# Patient Record
Sex: Male | Born: 1935 | Race: Black or African American | Hispanic: No | Smoking: Former smoker
Health system: Southern US, Community
[De-identification: ages and names within clinical notes are randomized; demographics above are authoritative.]

## PROBLEM LIST (undated history)

## (undated) DIAGNOSIS — F0280 Dementia in other diseases classified elsewhere without behavioral disturbance: Secondary | ICD-10-CM

## (undated) DIAGNOSIS — G309 Alzheimer's disease, unspecified: Secondary | ICD-10-CM

## (undated) DIAGNOSIS — K7460 Unspecified cirrhosis of liver: Secondary | ICD-10-CM

## (undated) DIAGNOSIS — K769 Liver disease, unspecified: Secondary | ICD-10-CM

## (undated) DIAGNOSIS — H00019 Hordeolum externum unspecified eye, unspecified eyelid: Secondary | ICD-10-CM

## (undated) DIAGNOSIS — F0390 Unspecified dementia without behavioral disturbance: Secondary | ICD-10-CM

## (undated) DIAGNOSIS — K7290 Hepatic failure, unspecified without coma: Secondary | ICD-10-CM

---

## 2013-11-14 ENCOUNTER — Emergency Department (HOSPITAL_COMMUNITY)
Admission: EM | Admit: 2013-11-14 | Discharge: 2013-11-14 | Disposition: A | Discharge status: Home / Self Care | Payer: Medicare Other | Attending: Emergency Medicine | Admitting: Emergency Medicine

## 2013-11-14 ENCOUNTER — Emergency Department (HOSPITAL_COMMUNITY): Payer: Medicare Other

## 2013-11-14 DIAGNOSIS — Y9389 Activity, other specified: Secondary | ICD-10-CM | POA: Diagnosis not present

## 2013-11-14 DIAGNOSIS — IMO0002 Reserved for concepts with insufficient information to code with codable children: Secondary | ICD-10-CM

## 2013-11-14 DIAGNOSIS — Z8719 Personal history of other diseases of the digestive system: Secondary | ICD-10-CM | POA: Diagnosis not present

## 2013-11-14 DIAGNOSIS — W1809XA Striking against other object with subsequent fall, initial encounter: Secondary | ICD-10-CM | POA: Diagnosis not present

## 2013-11-14 DIAGNOSIS — Z8669 Personal history of other diseases of the nervous system and sense organs: Secondary | ICD-10-CM | POA: Diagnosis not present

## 2013-11-14 DIAGNOSIS — Y929 Unspecified place or not applicable: Secondary | ICD-10-CM | POA: Diagnosis not present

## 2013-11-14 DIAGNOSIS — S0990XA Unspecified injury of head, initial encounter: Secondary | ICD-10-CM | POA: Diagnosis present

## 2013-11-14 DIAGNOSIS — Z792 Long term (current) use of antibiotics: Secondary | ICD-10-CM | POA: Diagnosis not present

## 2013-11-14 DIAGNOSIS — Z7982 Long term (current) use of aspirin: Secondary | ICD-10-CM | POA: Diagnosis not present

## 2013-11-14 DIAGNOSIS — D649 Anemia, unspecified: Secondary | ICD-10-CM

## 2013-11-14 DIAGNOSIS — G309 Alzheimer's disease, unspecified: Secondary | ICD-10-CM | POA: Insufficient documentation

## 2013-11-14 DIAGNOSIS — S1093XA Contusion of unspecified part of neck, initial encounter: Principal | ICD-10-CM

## 2013-11-14 DIAGNOSIS — S0003XA Contusion of scalp, initial encounter: Secondary | ICD-10-CM | POA: Insufficient documentation

## 2013-11-14 DIAGNOSIS — Z79899 Other long term (current) drug therapy: Secondary | ICD-10-CM | POA: Insufficient documentation

## 2013-11-14 DIAGNOSIS — F0280 Dementia in other diseases classified elsewhere without behavioral disturbance: Secondary | ICD-10-CM | POA: Diagnosis not present

## 2013-11-14 DIAGNOSIS — S0083XA Contusion of other part of head, initial encounter: Principal | ICD-10-CM | POA: Insufficient documentation

## 2013-11-14 HISTORY — DX: Dementia in other diseases classified elsewhere without behavioral disturbance: F02.80

## 2013-11-14 HISTORY — DX: Liver disease, unspecified: K76.9

## 2013-11-14 HISTORY — DX: Alzheimer's disease, unspecified: G30.9

## 2013-11-14 HISTORY — DX: Unspecified dementia without behavioral disturbance: F03.90

## 2013-11-14 HISTORY — DX: Hepatic failure, unspecified without coma: K72.90

## 2013-11-14 HISTORY — DX: Hordeolum externum unspecified eye, unspecified eyelid: H00.019

## 2013-11-14 LAB — BASIC METABOLIC PANEL
BUN: 18 mg/dL (ref 6–23)
CALCIUM: 9.3 mg/dL (ref 8.4–10.5)
CHLORIDE: 107 meq/L (ref 96–112)
CO2: 25 meq/L (ref 19–32)
Creatinine, Ser: 1.06 mg/dL (ref 0.50–1.35)
GFR calc Af Amer: 74 mL/min — ABNORMAL LOW (ref >90)
GFR calc non Af Amer: 64 mL/min — ABNORMAL LOW (ref >90)
Glucose, Bld: 97 mg/dL (ref 70–99)
Potassium: 4.1 mEq/L (ref 3.7–5.3)
SODIUM: 142 meq/L (ref 137–147)

## 2013-11-14 LAB — CBC WITH DIFFERENTIAL/PLATELET
BASOS ABS: 0 10*3/uL (ref 0.0–0.1)
Basophils Relative: 0 % (ref 0–1)
Eosinophils Absolute: 0 10*3/uL (ref 0.0–0.7)
Eosinophils Relative: 1 % (ref 0–5)
HCT: 27.7 % — ABNORMAL LOW (ref 39.0–52.0)
Hemoglobin: 9.1 g/dL — ABNORMAL LOW (ref 13.0–17.0)
LYMPHS PCT: 22 % (ref 12–46)
Lymphs Abs: 0.7 10*3/uL (ref 0.7–4.0)
MCH: 31.2 pg (ref 26.0–34.0)
MCHC: 32.9 g/dL (ref 30.0–36.0)
MCV: 94.9 fL (ref 78.0–100.0)
Monocytes Absolute: 0.2 10*3/uL (ref 0.1–1.0)
Monocytes Relative: 8 % (ref 3–12)
NEUTROS ABS: 2.2 10*3/uL (ref 1.7–7.7)
Neutrophils Relative %: 70 % (ref 43–77)
PLATELETS: 99 10*3/uL — AB (ref 150–400)
RBC: 2.92 MIL/uL — ABNORMAL LOW (ref 4.22–5.81)
RDW: 14.1 % (ref 11.5–15.5)
WBC: 3.1 10*3/uL — AB (ref 4.0–10.5)

## 2013-11-14 LAB — OCCULT BLOOD, POC DEVICE: Fecal Occult Bld: NEGATIVE

## 2013-11-14 LAB — URINALYSIS, ROUTINE W REFLEX MICROSCOPIC
Bilirubin Urine: NEGATIVE
GLUCOSE, UA: NEGATIVE mg/dL
Ketones, ur: NEGATIVE mg/dL
LEUKOCYTES UA: NEGATIVE
Nitrite: NEGATIVE
PH: 6.5 (ref 5.0–8.0)
PROTEIN: NEGATIVE mg/dL
Specific Gravity, Urine: 1.017 (ref 1.005–1.030)
Urobilinogen, UA: 1 mg/dL (ref 0.0–1.0)

## 2013-11-14 LAB — URINE MICROSCOPIC-ADD ON

## 2013-11-14 MED ORDER — SODIUM CHLORIDE 0.9 % IV SOLN
INTRAVENOUS | Status: DC
  Administered 2013-11-14: 09:00:00 via INTRAVENOUS

## 2013-11-14 NOTE — ED Notes (Signed)
Pt placed on bedpan with one staff assist to have BM. Pericare done, diaper applied, and pt repositioned in bed.

## 2013-11-14 NOTE — ED Provider Notes (Signed)
CSN: [id number]     Arrival date & time 11/14/13  0719 History   First MD Initiated Contact with Patient 11/14/13 0732     Chief Complaint  Patient presents with  . Fall    HPI The patient presents to emergency room for evaluation of a head injury. The patient himself does not recall what happened. He is not sure why he is here in the emergency room. The patient lives in a senior living facility. According to EMS reports, the patient was sitting in a chair when the chair slipped out from underneath him.  Patient struck the back of his head on the chair. Patient does complain of a mild headache.  He denies any other complaints. He denies chest pain or shortness of breath. He denies abdominal pain. He denies nausea vomiting diarrhea or fever. He denies any numbness or weakness. Past Medical History  Diagnosis Date  . Dementia   . Alzheimer disease   . Hepatic encephalopathy   . Liver disease   . Stye    History reviewed. No pertinent past surgical history.*No family history on file. History  Substance Use Topics  . Smoking status: Unknown If Ever Smoked  . Smokeless tobacco: Not on file  . Alcohol Use: No    Review of Systems  All other systems reviewed and are negative.      Allergies  Review of patient's allergies indicates no known allergies.  Home Medications   Current Outpatient Rx  Name  Route  Sig  Dispense  Refill  . aspirin 81 MG tablet   Oral   Take 81 mg by mouth daily.         . bicalutamide (CASODEX) 50 MG tablet   Oral   Take 50 mg by mouth daily.         Marland Kitchen erythromycin ophthalmic ointment      Apply 1/4 inch ribbon in the left eye four times per day         . haloperidol (HALDOL) 1 MG tablet   Oral   Take 1 mg by mouth 2 (two) times daily.         Marland Kitchen lactulose (CHRONULAC) 10 GM/15ML solution   Oral   Take 30 g by mouth 2 (two) times daily as needed for mild constipation.         . Melatonin 1 MG TABS   Oral   Take 1 tablet by mouth at  bedtime as needed (sleep).         . Multiple Vitamin (THERA/BETA-CAROTENE PO)   Oral   Take 1 tablet by mouth daily.         Marland Kitchen omeprazole (PRILOSEC) 20 MG capsule   Oral   Take 20 mg by mouth daily.          BP 149/65  Pulse 69  Temp(Src) 97.8 F (36.6 C) (Oral)  Resp 20  SpO2 100% Physical Exam  Nursing note and vitals reviewed. Constitutional: He appears well-developed and well-nourished. No distress.  HENT:  Head: Normocephalic.  Right Ear: External ear normal.  Left Ear: External ear normal.  Hematoma of the left posterior occipital region, no step-off appreciated  Eyes: Conjunctivae are normal. Right eye exhibits no discharge. Left eye exhibits no discharge. No scleral icterus.  Neck: Neck supple. No tracheal deviation present.  Cardiovascular: Normal rate, regular rhythm and intact distal pulses.   Pulmonary/Chest: Effort normal and breath sounds normal. No stridor. No respiratory distress. He has no wheezes. He has  no rales.  Abdominal: Soft. Bowel sounds are normal. He exhibits no distension. There is no tenderness. There is no rebound and no guarding.  Musculoskeletal: He exhibits no edema and no tenderness.       Cervical back: Normal.       Thoracic back: Normal.       Lumbar back: Normal.  No pain with range of motion of all 4 extremities  Neurological: He is alert. He has normal strength. No cranial nerve deficit (no facial droop, extraocular movements intact, no slurred speech) or sensory deficit. He exhibits normal muscle tone. He displays no seizure activity. Coordination normal.  Skin: Skin is warm and dry. No rash noted. He is not diaphoretic.  Psychiatric: He has a normal mood and affect.    ED Course  Procedures (including critical care time) Labs Review Labs Reviewed  CBC WITH DIFFERENTIAL - Abnormal; Notable for the following:    WBC 3.1 (*)    RBC 2.92 (*)    Hemoglobin 9.1 (*)    HCT 27.7 (*)    Platelets 99 (*)    All other components  within normal limits  BASIC METABOLIC PANEL - Abnormal; Notable for the following:    GFR calc non Af Amer 64 (*)    GFR calc Af Amer 74 (*)    All other components within normal limits  URINALYSIS, ROUTINE W REFLEX MICROSCOPIC - Abnormal; Notable for the following:    APPearance CLOUDY (*)    Hgb urine dipstick MODERATE (*)    All other components within normal limits  URINE MICROSCOPIC-ADD ON  OCCULT BLOOD, POC DEVICE   Imaging Review Ct Head Wo Contrast  11/14/2013   CLINICAL DATA:  Pain post trauma  EXAM: CT HEAD WITHOUT CONTRAST  TECHNIQUE: Contiguous axial images were obtained from the base of the skull through the vertex without intravenous contrast.  COMPARISON:  September 07, 2010  FINDINGS: There is moderate diffuse atrophy, stable. There is no mass, hemorrhage, extra-axial fluid collection, or midline shift. There is small vessel disease throughout the centra semiovale bilaterally. There is also small vessel disease in both internal and external capsules bilaterally. 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 There is no acute appearing infarct.  Bony calvarium appears intact. The mastoid air cells are clear. There is a small left parietal scalp hematoma.  IMPRESSION: Atrophy with supratentorial small vessel disease. 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 There is no intracranial mass, hemorrhage, or acute appearing infarct. There is a small left parietal scalp hematoma.   Electronically Signed   By: Lowella Grip M.D.   On: 11/14/2013 09:00   Ct Cervical Spine Wo Contrast  11/14/2013   CLINICAL DATA:  Fall.  Neck pain  EXAM: CT CERVICAL SPINE WITHOUT CONTRAST  TECHNIQUE: Multidetector CT imaging of the cervical spine was performed without intravenous contrast. Multiplanar CT image reconstructions were also generated.  COMPARISON:  09/07/10  FINDINGS: Straightening of normal cervical lordosis is identified. The vertebral body heights are well preserved.  There is no evidence for cervical spine fracture. Multi level disc space narrowing and ventral endplate spurring is identified compatible with degenerative disc disease. Schmorl's node is identified at the superior endplate of T1. Vascular calcifications are noted involving the carotid arteries.  IMPRESSION: 1. No acute findings. 2. Cervical spondylosis noted.   Electronically Signed   By: Kerby Moors M.D.   On: 11/14/2013 09:13    EKG Interpretation    Date/Time:  Tuesday November 14 2013 07:55:29 EST Ventricular Rate:  71 PR Interval:  221 QRS Duration: 82 QT Interval:  415 QTC Calculation: 451 R Axis:   65 Text Interpretation:  Sinus rhythm Prolonged PR interval PR interval prolonged since last tracing Confirmed by Sufian Ravi  MD-J, Isabel Freese ([id number]) on 11/14/2013 7:59:16 AM           0900  CT head completed.  Pt complained of neck pain with rad tech.  Will add on c spine. MDM   Final diagnoses:  Cephalohematoma  Anemia    No sign of serious injury associated with his fall.  Pt at baseline uses a wheelchair and does not walk per report from facility.  Pt is mildly confused but does have history of dementia.  Pt is aware that he does have anemia.  No recent labs to compare but no evidence of active bleeding.  He does not appear to symptomatic.  Stable to follow up with PCP     Kathalene Frames, MD 11/14/13 1005

## 2013-11-14 NOTE — ED Notes (Signed)
Unsuccessful IV attempt by this RN. Alvy Beal verbalizes will attempt IV insertion with [place].

## 2013-11-14 NOTE — ED Notes (Signed)
Pt arrived via EMS from [facility] s/p fall. Pt attempting to sit in chair, chair slide out from underneath patient causing him to strike head on chair. Pt denies loc or any complaints. Pt alert mae randomly.

## 2013-11-14 NOTE — ED Notes (Signed)
[facility] called for transport to [facility].  °

## 2013-11-14 NOTE — ED Notes (Signed)
Bed: YK99 Expected date: 11/14/13 Expected time: 6:47 AM Means of arrival: Ambulance Comments: Fall/head injury

## 2013-11-14 NOTE — Discharge Instructions (Signed)
Anemia, Nonspecific Anemia is a condition in which the concentration of red blood cells or hemoglobin in the blood is below normal. Hemoglobin is a substance in red blood cells that carries oxygen to the tissues of the body. Anemia results in not enough oxygen reaching these tissues.  CAUSES  Common causes of anemia include:   Excessive bleeding. Bleeding may be internal or external. This includes excessive bleeding from periods (in women) or from the intestine.   Poor nutrition.   Chronic kidney, thyroid, and liver disease.  Bone marrow disorders that decrease red blood cell production.  Cancer and treatments for cancer.  HIV, AIDS, and their treatments.  Spleen problems that increase red blood cell destruction.  Blood disorders.  Excess destruction of red blood cells due to infection, medicines, and autoimmune disorders. SIGNS AND SYMPTOMS   Minor weakness.   Dizziness.   Headache.  Palpitations.   Shortness of breath, especially with exercise.   Paleness.  Cold sensitivity.  Indigestion.  Nausea.  Difficulty sleeping.  Difficulty concentrating. Symptoms may occur suddenly or they may develop slowly.  DIAGNOSIS  Additional blood tests are often needed. These help your health care provider determine the best treatment. Your health care provider will check your stool for blood and look for other causes of blood loss.  TREATMENT  Treatment varies depending on the cause of the anemia. Treatment can include:   Supplements of iron, vitamin F81, or folic acid.   Hormone medicines.   A blood transfusion. This may be needed if blood loss is severe.   Hospitalization. This may be needed if there is significant continual blood loss.   Dietary changes.  Spleen removal. HOME CARE INSTRUCTIONS Keep all follow-up appointments. It often takes many weeks to correct anemia, and having your health care provider check on your condition and your response to  treatment is very important. SEEK IMMEDIATE MEDICAL CARE IF:   You develop extreme weakness, shortness of breath, or chest pain.   You become dizzy or have trouble concentrating.  You develop heavy vaginal bleeding.   You develop a rash.   You have bloody or black, tarry stools.   You faint.   You vomit up blood.   You vomit repeatedly.   You have abdominal pain.  You have a fever or persistent symptoms for more than 2 3 days.   You have a fever and your symptoms suddenly get worse.   You are dehydrated.  MAKE SURE YOU:  Understand these instructions.  Will watch your condition.  Will get help right away if you are not doing well or get worse. Document Released: 10/22/2004 Document Revised: 05/17/2013 Document Reviewed: 03/10/2013 [facility] Patient Information 2014 [place].  Contusion A contusion is a deep bruise. Contusions are the result of an injury that caused bleeding under the skin. The contusion may turn blue, purple, or yellow. Minor injuries will give you a painless contusion, but more severe contusions may stay painful and swollen for a few weeks.  CAUSES  A contusion is usually caused by a blow, trauma, or direct force to an area of the body. SYMPTOMS   Swelling and redness of the injured area.  Bruising of the injured area.  Tenderness and soreness of the injured area.  Pain. DIAGNOSIS  The diagnosis can be made by taking a history and physical exam. An X-ray, CT scan, or MRI may be needed to determine if there were any associated injuries, such as fractures. TREATMENT  Specific treatment will depend  on what area of the body was injured. In general, the best treatment for a contusion is resting, icing, elevating, and applying cold compresses to the injured area. Over-the-counter medicines may also be recommended for pain control. Ask your caregiver what the best treatment is for your contusion. HOME CARE INSTRUCTIONS   Put ice on the  injured area.  Put ice in a plastic bag.  Place a towel between your skin and the bag.  Leave the ice on for 15-20 minutes, 03-04 times a day.  Only take over-the-counter or prescription medicines for pain, discomfort, or fever as directed by your caregiver. Your caregiver may recommend avoiding anti-inflammatory medicines (aspirin, ibuprofen, and naproxen) for 48 hours because these medicines may increase bruising.  Rest the injured area.  If possible, elevate the injured area to reduce swelling. SEEK IMMEDIATE MEDICAL CARE IF:   You have increased bruising or swelling.  You have pain that is getting worse.  Your swelling or pain is not relieved with medicines. MAKE SURE YOU:   Understand these instructions.  Will watch your condition.  Will get help right away if you are not doing well or get worse. Document Released: 06/24/2005 Document Revised: 12/07/2011 Document Reviewed: 07/20/2011 [facility] Patient Information 2014 [place], [place].

## 2014-05-31 ENCOUNTER — Encounter (HOSPITAL_COMMUNITY): Payer: Self-pay | Admitting: Emergency Medicine

## 2014-05-31 ENCOUNTER — Emergency Department (HOSPITAL_COMMUNITY)
Admission: EM | Admit: 2014-05-31 | Discharge: 2014-06-01 | Disposition: A | Discharge status: Home / Self Care | Payer: Medicare Other | Attending: Emergency Medicine | Admitting: Emergency Medicine

## 2014-05-31 DIAGNOSIS — Z79899 Other long term (current) drug therapy: Secondary | ICD-10-CM | POA: Diagnosis not present

## 2014-05-31 DIAGNOSIS — G309 Alzheimer's disease, unspecified: Secondary | ICD-10-CM | POA: Diagnosis not present

## 2014-05-31 DIAGNOSIS — F0280 Dementia in other diseases classified elsewhere without behavioral disturbance: Secondary | ICD-10-CM | POA: Insufficient documentation

## 2014-05-31 DIAGNOSIS — F4329 Adjustment disorder with other symptoms: Secondary | ICD-10-CM

## 2014-05-31 DIAGNOSIS — Z7982 Long term (current) use of aspirin: Secondary | ICD-10-CM | POA: Diagnosis not present

## 2014-05-31 DIAGNOSIS — D649 Anemia, unspecified: Secondary | ICD-10-CM | POA: Diagnosis not present

## 2014-05-31 DIAGNOSIS — Z8719 Personal history of other diseases of the digestive system: Secondary | ICD-10-CM | POA: Insufficient documentation

## 2014-05-31 DIAGNOSIS — F0390 Unspecified dementia without behavioral disturbance: Secondary | ICD-10-CM | POA: Insufficient documentation

## 2014-05-31 DIAGNOSIS — F4324 Adjustment disorder with disturbance of conduct: Secondary | ICD-10-CM | POA: Insufficient documentation

## 2014-05-31 HISTORY — DX: Unspecified cirrhosis of liver: K74.60

## 2014-05-31 HISTORY — DX: Dementia in other diseases classified elsewhere, unspecified severity, without behavioral disturbance, psychotic disturbance, mood disturbance, and anxiety: F02.80

## 2014-05-31 HISTORY — DX: Alzheimer's disease, unspecified: G30.9

## 2014-05-31 LAB — I-STAT CHEM 8, ED
BUN: 25 mg/dL — AB (ref 6–23)
CALCIUM ION: 1.25 mmol/L (ref 1.13–1.30)
CHLORIDE: 114 meq/L — AB (ref 96–112)
CREATININE: 1.3 mg/dL (ref 0.50–1.35)
Glucose, Bld: 111 mg/dL — ABNORMAL HIGH (ref 70–99)
HCT: 28 % — ABNORMAL LOW (ref 39.0–52.0)
Hemoglobin: 9.5 g/dL — ABNORMAL LOW (ref 13.0–17.0)
Potassium: 3.9 mEq/L (ref 3.7–5.3)
Sodium: 148 mEq/L — ABNORMAL HIGH (ref 137–147)
TCO2: 23 mmol/L (ref 0–100)

## 2014-05-31 NOTE — Discharge Instructions (Signed)
If you were given medicines take as directed.  If you are on coumadin or contraceptives realize their levels and effectiveness is altered by many different medicines.  If you have any reaction (rash, tongues swelling, other) to the medicines stop taking and see a physician.   Please follow up as directed and return to the ER or see a physician for new or worsening symptoms.  Thank you. Filed Vitals:   05/31/14 2128 05/31/14 2212 05/31/14 2300 05/31/14 2344  BP: 138/49 134/53 148/60 144/56  Pulse: 80 78 78 74  Temp: 98.8 F (37.1 C)     TempSrc: Oral     Resp: 18 14  20   SpO2: 99% 100% 100% 100%
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
## 2014-11-07 ENCOUNTER — Emergency Department (HOSPITAL_COMMUNITY): Payer: Medicare Other

## 2014-11-07 ENCOUNTER — Emergency Department (HOSPITAL_COMMUNITY)
Admission: EM | Admit: 2014-11-07 | Discharge: 2014-11-07 | Disposition: A | Discharge status: Home / Self Care | Payer: Medicare Other | Attending: Emergency Medicine | Admitting: Emergency Medicine

## 2014-11-07 ENCOUNTER — Encounter (HOSPITAL_COMMUNITY): Payer: Self-pay

## 2014-11-07 DIAGNOSIS — Y998 Other external cause status: Secondary | ICD-10-CM | POA: Diagnosis not present

## 2014-11-07 DIAGNOSIS — Z792 Long term (current) use of antibiotics: Secondary | ICD-10-CM | POA: Diagnosis not present

## 2014-11-07 DIAGNOSIS — Z8719 Personal history of other diseases of the digestive system: Secondary | ICD-10-CM | POA: Insufficient documentation

## 2014-11-07 DIAGNOSIS — F0280 Dementia in other diseases classified elsewhere without behavioral disturbance: Secondary | ICD-10-CM | POA: Insufficient documentation

## 2014-11-07 DIAGNOSIS — Y9389 Activity, other specified: Secondary | ICD-10-CM | POA: Diagnosis not present

## 2014-11-07 DIAGNOSIS — S3992XA Unspecified injury of lower back, initial encounter: Secondary | ICD-10-CM | POA: Insufficient documentation

## 2014-11-07 DIAGNOSIS — Z79899 Other long term (current) drug therapy: Secondary | ICD-10-CM | POA: Insufficient documentation

## 2014-11-07 DIAGNOSIS — W19XXXA Unspecified fall, initial encounter: Secondary | ICD-10-CM

## 2014-11-07 DIAGNOSIS — Z7982 Long term (current) use of aspirin: Secondary | ICD-10-CM | POA: Insufficient documentation

## 2014-11-07 DIAGNOSIS — G309 Alzheimer's disease, unspecified: Secondary | ICD-10-CM | POA: Diagnosis not present

## 2014-11-07 DIAGNOSIS — W1839XA Other fall on same level, initial encounter: Secondary | ICD-10-CM | POA: Insufficient documentation

## 2014-11-07 DIAGNOSIS — Y9289 Other specified places as the place of occurrence of the external cause: Secondary | ICD-10-CM | POA: Insufficient documentation

## 2014-11-07 MED ORDER — ACETAMINOPHEN 325 MG PO TABS
650.0000 mg | ORAL_TABLET | Freq: Once | ORAL | Status: AC
  Administered 2014-11-07: 650 mg via ORAL
  Filled 2014-11-07: qty 2

## 2014-11-07 NOTE — ED Provider Notes (Signed)
CSN: [id number]     Arrival date & time 11/07/14  0810 History   First MD Initiated Contact with Patient 11/07/14 [phone]     Chief Complaint  Patient presents with  . Fall  . Back Pain      HPI  She presents for evaluation after a fall at a skilled nursing facilities morning. Found sitting on his buttocks in the hallway. States he hit his head but was not noted to have any visible trauma or laceration. At his baseline per evaluation at [facility] per staff. No recent illness. Denies any areas of pain other than the headache and low back pain. No cough sputum or difficult breathing. No recent fevers chills per his chart accompanying him.  Past Medical History  Diagnosis Date  . Alzheimer's dementia   . Liver cirrhosis   . Alzheimer's dementia    No past surgical history on file. No family history on file. History  Substance Use Topics  . Smoking status: Not on file  . Smokeless tobacco: Not on file  . Alcohol Use: Not on file    Review of Systems  Unable to perform ROS: Dementia      Allergies  Review of patient's allergies indicates no known allergies.  Home Medications   Prior to Admission medications   Medication Sig Start Date End Date Taking? Authorizing Provider  acetaminophen (TYLENOL) 500 MG tablet Take 500 mg by mouth every 4 (four) hours as needed for moderate pain or fever.   Yes Historical Provider, MD  alum & mag hydroxide-simeth (MAALOX/MYLANTA) 200-200-20 MG/5ML suspension Take 30 mLs by mouth every 6 (six) hours as needed for indigestion or heartburn.   Yes Historical Provider, MD  aspirin 81 MG tablet Take 81 mg by mouth daily.   Yes Historical Provider, MD  bicalutamide (CASODEX) 50 MG tablet Take 50 mg by mouth daily.   Yes Historical Provider, MD  Cholecalciferol (VITAMIN D-3) 5000 UNITS TABS Take 1 capsule by mouth daily.   Yes Historical Provider, MD  furosemide (LASIX) 20 MG tablet Take 20 mg by mouth daily.   Yes Historical Provider, MD   guaiFENesin (ROBITUSSIN) 100 MG/5ML liquid Take 200 mg by mouth every 6 (six) hours as needed for cough.   Yes Historical Provider, MD  haloperidol (HALDOL) 1 MG tablet Take 1 mg by mouth 2 (two) times daily.   Yes Historical Provider, MD  lactulose (CHRONULAC) 10 GM/15ML solution Take 30 g by mouth 2 (two) times daily.    Yes Historical Provider, MD  latanoprost (XALATAN) 0.005 % ophthalmic solution Place 1 drop into both eyes at bedtime.   Yes Historical Provider, MD  loperamide (IMODIUM) 2 MG capsule Take 2 mg by mouth daily as needed for diarrhea or loose stools.   Yes Historical Provider, MD  magnesium hydroxide (MILK OF MAGNESIA) 400 MG/5ML suspension Take 30 mLs by mouth daily as needed for mild constipation.   Yes Historical Provider, MD  Melatonin 1 MG TABS Take 1 tablet by mouth at bedtime.   Yes Historical Provider, MD  Multiple Vitamin (THERA/BETA-CAROTENE) TABS Take 1 tablet by mouth daily.   Yes Historical Provider, MD  neomycin-bacitracin-polymyxin (NEOSPORIN) [phone] ointment Apply 1 application topically daily as needed (for skin tears or abrasions).   Yes Historical Provider, MD  omeprazole (PRILOSEC) 20 MG capsule Take 20 mg by mouth daily.   Yes Historical Provider, MD  rivastigmine (EXELON) 9.5 mg/24hr Place 9.5 mg onto the skin daily.   Yes Historical Provider, MD  sucralfate (CARAFATE) 1 G tablet Take 1 g by mouth 2 (two) times daily.   Yes Historical Provider, MD  traMADol (ULTRAM) 50 MG tablet Take 50 mg by mouth as directed. Take 50mg  Daily; also may take 1 tablet every 6 hours as needed for pain. ( do not take more than 200mg /24 hours)   Yes Historical Provider, MD  amoxicillin-clavulanate (AUGMENTIN) 875-125 MG per tablet Take 1 tablet by mouth 2 (two) times daily.    Historical Provider, MD   BP 151/55 mmHg  Pulse 85  Temp(Src) 97.8 F (36.6 C) (Oral)  Resp 18  SpO2 98% Physical Exam  Constitutional: He appears well-developed and well-nourished. No distress.   HENT:  Head: Normocephalic.  Eyes: Conjunctivae are normal. Pupils are equal, round, and reactive to light. No scleral icterus.  Neck: Normal range of motion. Neck supple. No thyromegaly present.  Cardiovascular: Normal rate and regular rhythm.  Exam reveals no gallop and no friction rub.   No murmur heard. Pulmonary/Chest: Effort normal and breath sounds normal. No respiratory distress. He has no wheezes. He has no rales.  Abdominal: Soft. Bowel sounds are normal. He exhibits no distension. There is no tenderness. There is no rebound.  Musculoskeletal: Normal range of motion.  Neurological: He is alert.  Skin: Skin is warm and dry. No rash noted.  Psychiatric: He has a normal mood and affect. His behavior is normal.    ED Course  Procedures (including critical care time) Labs Review Labs Reviewed - No data to display  Imaging Review Dg Lumbar Spine Complete  11/07/2014   CLINICAL DATA:  Recent fall with low back pain radiating into the hips bilaterally  EXAM: LUMBAR SPINE - COMPLETE 4+ VIEW  COMPARISON:  None.  FINDINGS: Five lumbar type vertebral bodies are well visualized. Vertebral body height is well maintained. Osteophytic changes are noted at all levels. No significant disc space narrowing is seen. No pars defects are noted. Diffuse aortoiliac calcifications are seen.  IMPRESSION: Multilevel degenerative change without acute abnormality.   Electronically Signed   By: Inez Catalina M.D.   On: 11/07/2014 09:56   Ct Head Wo Contrast  11/07/2014   CLINICAL DATA:  Fall with head injury.  Initial encounter.  EXAM: CT HEAD WITHOUT CONTRAST  CT CERVICAL SPINE WITHOUT CONTRAST  TECHNIQUE: Multidetector CT imaging of the head and cervical spine was performed following the standard protocol without intravenous contrast. Multiplanar CT image reconstructions of the cervical spine were also generated.  COMPARISON:  None.  FINDINGS: CT HEAD FINDINGS  Skull and Sinuses:There is opacification the  right sphenoid sinus, with large lateral recess. No mucocele as the anterior sphenoid sinus is aerated and the sphenoid sinus ostium is patent. Symmetric appearance of the carotid canals.  Orbits: Bilateral cataract resection.  No traumatic findings  Brain: No evidence of acute infarction, hemorrhage, hydrocephalus, or mass lesion/mass effect.  There is advanced chronic small vessel disease with ischemic gliosis throughout the deep cerebral white matter. Heterogeneous and indistinct appearance of the bilateral putamen is likely also from chronic hypertension/small-vessel disease. Generalized brain atrophy.  CT CERVICAL SPINE FINDINGS  Negative for acute fracture or subluxation. Irregularity of the superior endplate of T1 is most consistent with a chronic Schmorl's node. No prevertebral edema. No gross cervical canal hematoma.  Diffuse degenerative disc narrowing and spondylotic spurring, most progressed in the mid and lower cervical spine. Moderate, diffuse facet arthropathy with spurring. No evidence for high-grade canal stenosis.  Heavy deposition of calcified atherosclerotic plaque at  the carotid bifurcations.  IMPRESSION: 1. No evidence of acute intracranial or cervical spine injury. 2. Brain atrophy and advanced small vessel disease. 3. Chronic right sphenoid sinusitis.   Electronically Signed   By: Monte Fantasia M.D.   On: 11/07/2014 10:03   Ct Cervical Spine Wo Contrast  11/07/2014   CLINICAL DATA:  Fall with head injury.  Initial encounter.  EXAM: CT HEAD WITHOUT CONTRAST  CT CERVICAL SPINE WITHOUT CONTRAST  TECHNIQUE: Multidetector CT imaging of the head and cervical spine was performed following the standard protocol without intravenous contrast. Multiplanar CT image reconstructions of the cervical spine were also generated.  COMPARISON:  None.  FINDINGS: CT HEAD FINDINGS  Skull and Sinuses:There is opacification the right sphenoid sinus, with large lateral recess. No mucocele as the anterior sphenoid  sinus is aerated and the sphenoid sinus ostium is patent. Symmetric appearance of the carotid canals.  Orbits: Bilateral cataract resection.  No traumatic findings  Brain: No evidence of acute infarction, hemorrhage, hydrocephalus, or mass lesion/mass effect.  There is advanced chronic small vessel disease with ischemic gliosis throughout the deep cerebral white matter. Heterogeneous and indistinct appearance of the bilateral putamen is likely also from chronic hypertension/small-vessel disease. Generalized brain atrophy.  CT CERVICAL SPINE FINDINGS  Negative for acute fracture or subluxation. Irregularity of the superior endplate of T1 is most consistent with a chronic Schmorl's node. No prevertebral edema. No gross cervical canal hematoma.  Diffuse degenerative disc narrowing and spondylotic spurring, most progressed in the mid and lower cervical spine. Moderate, diffuse facet arthropathy with spurring. No evidence for high-grade canal stenosis.  Heavy deposition of calcified atherosclerotic plaque at the carotid bifurcations.  IMPRESSION: 1. No evidence of acute intracranial or cervical spine injury. 2. Brain atrophy and advanced small vessel disease. 3. Chronic right sphenoid sinusitis.   Electronically Signed   By: Monte Fantasia M.D.   On: 11/07/2014 10:03     EKG Interpretation None      MDM   Final diagnoses:  Fall    No acute abnormality is noted on x-ray. Reassuring exam. Laboratory. He is appropriate for discharge back to his care facility.    Tanna Furry, MD 11/07/14 1125

## 2014-11-07 NOTE — ED Notes (Signed)
Patient refused to have vitals taken. Pt stated to just let him sleep. QA

## 2014-11-07 NOTE — Discharge Instructions (Signed)
Tylenol for any painful areas.  Primary care physician as needed.

## 2014-11-07 NOTE — Progress Notes (Signed)
Pt from [facility] Noted pt with c/o fall today.  Reviewed EPIC notes and forms from ALF at bedside Cm went to speak with pt  Called pt name out x 3 standing at his bedside and pt did not awake to CM calling his name loudly x 3

## 2014-11-07 NOTE — ED Notes (Signed)
Patient transported to CT 

## 2014-11-07 NOTE — ED Notes (Signed)
Bed: WA04 Expected date:  Expected time:  Means of arrival:  Comments: EMS- elderly fall

## 2014-11-07 NOTE — ED Notes (Signed)
Per EMS, pt from [facility].  Pt fell at around 7:20 am.  Pt mechanism of fall unknown.  ? Loss of balance.  Pt found on buttocks.  Pt states he hit his head.  No visible trauma noted.  Unwitnessed for unknown LOC. Pt found by EMS and staff alert and oriented to baseline.  No blood thinners.  C/O lower back pain. Vitals: 196/110, hr 86, resp 16,

## 2014-11-12 ENCOUNTER — Emergency Department (HOSPITAL_COMMUNITY)
Admission: EM | Admit: 2014-11-12 | Discharge: 2014-11-12 | Disposition: A | Discharge status: Home / Self Care | Payer: Medicare Other | Attending: Emergency Medicine | Admitting: Emergency Medicine

## 2014-11-12 ENCOUNTER — Encounter (HOSPITAL_COMMUNITY): Payer: Self-pay | Admitting: Family Medicine

## 2014-11-12 DIAGNOSIS — Y9389 Activity, other specified: Secondary | ICD-10-CM | POA: Insufficient documentation

## 2014-11-12 DIAGNOSIS — Z8719 Personal history of other diseases of the digestive system: Secondary | ICD-10-CM | POA: Diagnosis not present

## 2014-11-12 DIAGNOSIS — Y92128 Other place in nursing home as the place of occurrence of the external cause: Secondary | ICD-10-CM | POA: Insufficient documentation

## 2014-11-12 DIAGNOSIS — S8991XA Unspecified injury of right lower leg, initial encounter: Secondary | ICD-10-CM | POA: Diagnosis present

## 2014-11-12 DIAGNOSIS — W19XXXA Unspecified fall, initial encounter: Secondary | ICD-10-CM | POA: Insufficient documentation

## 2014-11-12 DIAGNOSIS — Z792 Long term (current) use of antibiotics: Secondary | ICD-10-CM | POA: Insufficient documentation

## 2014-11-12 DIAGNOSIS — Y998 Other external cause status: Secondary | ICD-10-CM | POA: Insufficient documentation

## 2014-11-12 DIAGNOSIS — G309 Alzheimer's disease, unspecified: Secondary | ICD-10-CM | POA: Diagnosis not present

## 2014-11-12 DIAGNOSIS — Z7982 Long term (current) use of aspirin: Secondary | ICD-10-CM | POA: Insufficient documentation

## 2014-11-12 DIAGNOSIS — F0280 Dementia in other diseases classified elsewhere without behavioral disturbance: Secondary | ICD-10-CM | POA: Diagnosis not present

## 2014-11-12 DIAGNOSIS — Z79899 Other long term (current) drug therapy: Secondary | ICD-10-CM | POA: Diagnosis not present

## 2014-11-12 DIAGNOSIS — S80811A Abrasion, right lower leg, initial encounter: Secondary | ICD-10-CM | POA: Insufficient documentation

## 2014-11-12 NOTE — ED Notes (Signed)
[facility] arrived to transport pt back to nursing facility.

## 2014-11-12 NOTE — ED Notes (Signed)
Bed: PH43 Expected date:  Expected time:  Means of arrival:  Comments: EMS 81yo M fall

## 2014-11-12 NOTE — Discharge Instructions (Signed)

## 2014-11-12 NOTE — ED Provider Notes (Signed)
CSN: [id number]     Arrival date & time 11/12/14  0253 History   First MD Initiated Contact with Patient 11/12/14 0301     Chief Complaint  Patient presents with  . Fall     (Consider location/radiation/quality/duration/timing/severity/associated sxs/prior Treatment) Patient is a 81 y.o. male presenting with fall. The history is provided by the patient. No language interpreter was used.  Fall Pertinent negatives include no chills or fever. Associated symptoms comments: The patient was found on the floor of the nursing facility, sitting up, and is known to "slide" from the bed multiple times in the past, landing in sitting position on the floor. Tonight, fall was unwitnessed. Per EMS, the patient had no complaints in route. Here he also denies any pain, injury, headache, nausea, chest pain or dysuria..    Past Medical History  Diagnosis Date  . Alzheimer's dementia   . Liver cirrhosis   . Alzheimer's dementia    History reviewed. No pertinent past surgical history. History reviewed. No pertinent family history. History  Substance Use Topics  . Smoking status: Unknown If Ever Smoked  . Smokeless tobacco: Not on file  . Alcohol Use: No    Review of Systems  Constitutional: Negative for fever and chills.  HENT: Negative.   Respiratory: Negative.   Cardiovascular: Negative.   Gastrointestinal: Negative.   Musculoskeletal: Negative.   Skin: Negative.   Neurological: Negative.       Allergies  Review of patient's allergies indicates no known allergies.  Home Medications   Prior to Admission medications   Medication Sig Start Date End Date Taking? Authorizing Provider  amoxicillin-clavulanate (AUGMENTIN) 875-125 MG per tablet Take 1 tablet by mouth 2 (two) times daily.   Yes Historical Provider, MD  aspirin 81 MG tablet Take 81 mg by mouth daily.   Yes Historical Provider, MD  bicalutamide (CASODEX) 50 MG tablet Take 50 mg by mouth daily.   Yes Historical Provider, MD   Cholecalciferol (VITAMIN D-3) 5000 UNITS TABS Take 5,000 Units by mouth daily.    Yes Historical Provider, MD  furosemide (LASIX) 20 MG tablet Take 20 mg by mouth daily.   Yes Historical Provider, MD  haloperidol (HALDOL) 1 MG tablet Take 1 mg by mouth 2 (two) times daily.   Yes Historical Provider, MD  lactulose (CHRONULAC) 10 GM/15ML solution Take 30 g by mouth 2 (two) times daily.    Yes Historical Provider, MD  latanoprost (XALATAN) 0.005 % ophthalmic solution Place 1 drop into both eyes at bedtime.   Yes Historical Provider, MD  Melatonin 1 MG TABS Take 1 tablet by mouth at bedtime.   Yes Historical Provider, MD  Multiple Vitamin (THERA/BETA-CAROTENE) TABS Take 1 tablet by mouth daily.   Yes Historical Provider, MD  omeprazole (PRILOSEC) 20 MG capsule Take 20 mg by mouth daily.   Yes Historical Provider, MD  rivastigmine (EXELON) 9.5 mg/24hr Place 9.5 mg onto the skin daily.   Yes Historical Provider, MD  sucralfate (CARAFATE) 1 G tablet Take 1 g by mouth 2 (two) times daily.   Yes Historical Provider, MD  traMADol (ULTRAM) 50 MG tablet Take 50 mg by mouth as directed. Take 50mg  Daily; also may take 1 tablet every 6 hours as needed for pain. ( do not take more than 200mg /24 hours)   Yes Historical Provider, MD  acetaminophen (TYLENOL) 500 MG tablet Take 500 mg by mouth every 4 (four) hours as needed for moderate pain or fever.    Historical Provider, MD  alum & mag hydroxide-simeth (MAALOX/MYLANTA) 200-200-20 MG/5ML suspension Take 30 mLs by mouth every 6 (six) hours as needed for indigestion or heartburn.    Historical Provider, MD  guaiFENesin (ROBITUSSIN) 100 MG/5ML liquid Take 200 mg by mouth every 6 (six) hours as needed for cough.    Historical Provider, MD  loperamide (IMODIUM) 2 MG capsule Take 2 mg by mouth daily as needed for diarrhea or loose stools.    Historical Provider, MD  magnesium hydroxide (MILK OF MAGNESIA) 400 MG/5ML suspension Take 30 mLs by mouth daily as needed for mild  constipation.    Historical Provider, MD  neomycin-bacitracin-polymyxin (NEOSPORIN) [phone] ointment Apply 1 application topically daily as needed (for skin tears or abrasions).    Historical Provider, MD   BP 171/63 mmHg  Pulse 91  Temp(Src) 98 F (36.7 C) (Oral)  Resp 18  Ht 5\' 10"  (1.778 m)  Wt 200 lb (90.719 kg)  BMI 28.70 kg/m2  SpO2 100% Physical Exam  Constitutional: He is oriented to person, place, and time. He appears well-developed and well-nourished.  HENT:  Head: Normocephalic.  Neck: Normal range of motion. Neck supple.  Cardiovascular: Normal rate.   Pulmonary/Chest: Effort normal.  Abdominal: Soft. Bowel sounds are normal. There is no tenderness. There is no rebound and no guarding.  Musculoskeletal: Normal range of motion.  FROM all extremities. No midline or paraspinal tenderness.   Neurological: He is alert and oriented to person, place, and time.  Skin: Skin is warm and dry. No rash noted.  No bruising or lacerations. There is a small superficial abrasion right shin without swelling or tenderness.   Psychiatric: He has a normal mood and affect.    ED Course  Procedures (including critical care time) Labs Review Labs Reviewed - No data to display  Imaging Review No results found.   EKG Interpretation None      MDM   Final diagnoses:  None    1. Fall 2. Abrasion right shin  The patient is awake, alert and oriented. No complaints. Essentially normal exam. Stable for discharge home.     Dewaine Oats, PA-C 11/12/14 [id number]  Julianne Rice, MD 11/12/14 [phone]

## 2014-11-12 NOTE — ED Notes (Signed)
Per EMS, patient was found sitting on the floor by staff at [facility]. Pt has been known to slide out of bed. Pt was placed in c-collar. No LOC.

## 2014-11-12 NOTE — ED Notes (Signed)
Awake. Verbally responsive. A/O x4. Resp even and unlabored. No audible adventitious breath sounds noted. ABC's intact.  

## 2014-11-12 NOTE — ED Notes (Signed)
Communication called for [facility] transport back to facility.

## 2014-11-12 NOTE — ED Notes (Signed)
C-collar removed from [facility].

## 2014-12-02 ENCOUNTER — Emergency Department (HOSPITAL_COMMUNITY): Payer: Medicare Other

## 2014-12-02 ENCOUNTER — Other Ambulatory Visit: Payer: Self-pay

## 2014-12-02 ENCOUNTER — Emergency Department (HOSPITAL_COMMUNITY)
Admission: EM | Admit: 2014-12-02 | Discharge: 2014-12-02 | Disposition: A | Discharge status: Home / Self Care | Payer: Medicare Other | Attending: Emergency Medicine | Admitting: Emergency Medicine

## 2014-12-02 ENCOUNTER — Encounter (HOSPITAL_COMMUNITY): Payer: Self-pay

## 2014-12-02 DIAGNOSIS — S0083XA Contusion of other part of head, initial encounter: Secondary | ICD-10-CM | POA: Insufficient documentation

## 2014-12-02 DIAGNOSIS — W01198A Fall on same level from slipping, tripping and stumbling with subsequent striking against other object, initial encounter: Secondary | ICD-10-CM | POA: Diagnosis not present

## 2014-12-02 DIAGNOSIS — Z7982 Long term (current) use of aspirin: Secondary | ICD-10-CM | POA: Diagnosis not present

## 2014-12-02 DIAGNOSIS — T148XXA Other injury of unspecified body region, initial encounter: Secondary | ICD-10-CM

## 2014-12-02 DIAGNOSIS — F0280 Dementia in other diseases classified elsewhere without behavioral disturbance: Secondary | ICD-10-CM | POA: Diagnosis not present

## 2014-12-02 DIAGNOSIS — S0990XA Unspecified injury of head, initial encounter: Secondary | ICD-10-CM | POA: Diagnosis not present

## 2014-12-02 DIAGNOSIS — Z79899 Other long term (current) drug therapy: Secondary | ICD-10-CM | POA: Diagnosis not present

## 2014-12-02 DIAGNOSIS — Y998 Other external cause status: Secondary | ICD-10-CM | POA: Diagnosis not present

## 2014-12-02 DIAGNOSIS — Z8719 Personal history of other diseases of the digestive system: Secondary | ICD-10-CM | POA: Insufficient documentation

## 2014-12-02 DIAGNOSIS — Y9389 Activity, other specified: Secondary | ICD-10-CM | POA: Diagnosis not present

## 2014-12-02 DIAGNOSIS — G309 Alzheimer's disease, unspecified: Secondary | ICD-10-CM | POA: Insufficient documentation

## 2014-12-02 DIAGNOSIS — Y9289 Other specified places as the place of occurrence of the external cause: Secondary | ICD-10-CM | POA: Diagnosis not present

## 2014-12-02 DIAGNOSIS — W19XXXA Unspecified fall, initial encounter: Secondary | ICD-10-CM

## 2014-12-02 NOTE — ED Provider Notes (Signed)
CSN: [id number]     Arrival date & time 12/02/14  1729 History   First MD Initiated Contact with Patient 12/02/14 1814     Chief Complaint  Patient presents with  . Fall  . Head Injury     (Consider location/radiation/quality/duration/timing/severity/associated sxs/prior Treatment) The history is provided by the patient. No language interpreter was used.  Aeon Kessner is an 81 y/o M with PMHx of liver cirrhosis and Alzheimer's, resident from [place] who presents to the ED, BIB EMS, after a fall that occurred this afternoon. This provider spoke with Beckie Busing, RN from [facility]. Reported that patient was walking down the hallway and fell - hitting his head on the railing. Reported that patient call for help. Stated that patient was alert - denied LOC of the patient. Stated that patient answered questions at the scene. Reported that he did not have any complaints. Reported that the patient did land on his right side. Reported that the patient is at baseline, denied changes to personality or behavior. ROS limited secondary to dementia.  Level V caveat  PCP Dr. Valetta Close   Past Medical History  Diagnosis Date  . Alzheimer's dementia   . Liver cirrhosis   . Alzheimer's dementia    History reviewed. No pertinent past surgical history. No family history on file. History  Substance Use Topics  . Smoking status: Unknown If Ever Smoked  . Smokeless tobacco: Not on file  . Alcohol Use: No    Review of Systems  Unable to perform ROS: Dementia  Level V Caveat    Allergies  Review of patient's allergies indicates no known allergies.  Home Medications   Prior to Admission medications   Medication Sig Start Date End Date Taking? Authorizing Provider  acetaminophen (TYLENOL) 500 MG tablet Take 500 mg by mouth every 4 (four) hours as needed for moderate pain or fever.   Yes Historical Provider, MD  alum & mag hydroxide-simeth (MAALOX/MYLANTA) 200-200-20 MG/5ML suspension Take 30 mLs  by mouth every 6 (six) hours as needed for indigestion or heartburn.   Yes Historical Provider, MD  aspirin 81 MG tablet Take 81 mg by mouth daily.   Yes Historical Provider, MD  bicalutamide (CASODEX) 50 MG tablet Take 50 mg by mouth daily.   Yes Historical Provider, MD  Cholecalciferol (VITAMIN D-3) 5000 UNITS TABS Take 5,000 Units by mouth daily.    Yes Historical Provider, MD  furosemide (LASIX) 20 MG tablet Take 20 mg by mouth daily.   Yes Historical Provider, MD  guaiFENesin (ROBITUSSIN) 100 MG/5ML liquid Take 200 mg by mouth every 6 (six) hours as needed for cough.   Yes Historical Provider, MD  haloperidol (HALDOL) 1 MG tablet Take 1 mg by mouth 2 (two) times daily.   Yes Historical Provider, MD  lactulose (CHRONULAC) 10 GM/15ML solution Take 30 g by mouth 2 (two) times daily.    Yes Historical Provider, MD  latanoprost (XALATAN) 0.005 % ophthalmic solution Place 1 drop into both eyes at bedtime.   Yes Historical Provider, MD  loperamide (IMODIUM) 2 MG capsule Take 2 mg by mouth daily as needed for diarrhea or loose stools.   Yes Historical Provider, MD  magnesium hydroxide (MILK OF MAGNESIA) 400 MG/5ML suspension Take 30 mLs by mouth daily as needed for mild constipation.   Yes Historical Provider, MD  Melatonin 1 MG TABS Take 1 tablet by mouth at bedtime.   Yes Historical Provider, MD  Multiple Vitamin (THERA/BETA-CAROTENE) TABS Take 1 tablet by mouth  daily.   Yes Historical Provider, MD  neomycin-bacitracin-polymyxin (NEOSPORIN) [phone] ointment Apply 1 application topically daily as needed (for skin tears or abrasions).   Yes Historical Provider, MD  omeprazole (PRILOSEC) 20 MG capsule Take 20 mg by mouth daily.   Yes Historical Provider, MD  rivastigmine (EXELON) 9.5 mg/24hr Place 9.5 mg onto the skin daily.   Yes Historical Provider, MD  sucralfate (CARAFATE) 1 G tablet Take 1 g by mouth 2 (two) times daily.   Yes Historical Provider, MD  traMADol (ULTRAM) 50 MG tablet Take 50 mg by  mouth as directed. Take 50mg  Daily; also may take 1 tablet every 6 hours as needed for pain. ( do not take more than 200mg /24 hours)   Yes Historical Provider, MD   BP 140/57 mmHg  Pulse 88  Temp(Src) 97.4 F (36.3 C) (Oral)  Resp 20  SpO2 99% Physical Exam  Constitutional: He appears well-developed and well-nourished. No distress.  HENT:  Head: Normocephalic. Head is with contusion. Head is without raccoon's eyes, without Battle's sign and without abrasion.    Mouth/Throat: Oropharynx is clear and moist. No oropharyngeal exudate.  Hematoma noted to the right frontal aspect - negative bleeding or drainage  Eyes: Conjunctivae and EOM are normal. Pupils are equal, round, and reactive to light. Right eye exhibits no discharge. Left eye exhibits no discharge.  Neck: Normal range of motion. Neck supple.  Cardiovascular: Normal rate, regular rhythm and normal heart sounds.  Exam reveals no friction rub.   No murmur heard. Pulses:      Radial pulses are 2+ on the right side, and 2+ on the left side.       Dorsalis pedis pulses are 2+ on the right side, and 2+ on the left side.  Pulmonary/Chest: Effort normal and breath sounds normal. No respiratory distress. He has no wheezes. He has no rales.  Abdominal: Soft. Bowel sounds are normal. He exhibits no distension. There is no tenderness. There is no rebound and no guarding.  Musculoskeletal: Normal range of motion.  Full ROM to upper and lower extremities without difficulty noted, negative ataxia noted. Negative signs of trauma to extremities.   Neurological: He is alert. No cranial nerve deficit. He exhibits normal muscle tone. Coordination normal.  Follows commands  Patient knows his name and birthday   Skin: Skin is warm and dry. No rash noted. He is not diaphoretic. No erythema.  Psychiatric: He has a normal mood and affect. His behavior is normal. Thought content normal.  Nursing note and vitals reviewed.   ED Course  Procedures  (including critical care time)  Dg Lumbar Spine Complete  11/07/2014   CLINICAL DATA:  Recent fall with low back pain radiating into the hips bilaterally  EXAM: LUMBAR SPINE - COMPLETE 4+ VIEW  COMPARISON:  None.  FINDINGS: Five lumbar type vertebral bodies are well visualized. Vertebral body height is well maintained. Osteophytic changes are noted at all levels. No significant disc space narrowing is seen. No pars defects are noted. Diffuse aortoiliac calcifications are seen.  IMPRESSION: Multilevel degenerative change without acute abnormality.   Electronically Signed   By: Inez Catalina M.D.   On: 11/07/2014 09:56   Ct Head Wo Contrast  12/02/2014   CLINICAL DATA:  Unwitnessed fall. Contusion along the right forehead. Alzheimer dementia. Head injury.  EXAM: CT HEAD WITHOUT CONTRAST  CT CERVICAL SPINE WITHOUT CONTRAST  TECHNIQUE: Multidetector CT imaging of the head and cervical spine was performed following the standard protocol without intravenous  contrast. Multiplanar CT image reconstructions of the cervical spine were also generated.  COMPARISON:  11/07/2014  FINDINGS: CT HEAD FINDINGS  Chronic lacunar infarcts an both basal ganglia. Periventricular white matter and corona radiata hypodensities favor chronic ischemic microvascular white matter disease.  Otherwise, The brainstem, cerebellum, cerebral peduncles, thalamus, basal ganglia, basilar cisterns, and ventricular system appear within normal limits. No intracranial hemorrhage, mass lesion, or acute CVA. Right forehead scalp hematoma noted.  Poor cortical definition of the right lateral wall of the right sphenoid sinus from the middle cranial fossa. Chronic right sphenoid sinusitis. Appearance not significantly changed from 11/07/2014.  Remote left medial orbital wall fracture. There is atherosclerotic calcification of the cavernous carotid arteries bilaterally.  CT CERVICAL SPINE FINDINGS  Multilevel cervical spondylosis and degenerative disc disease  with loss of disc height most notable at C5-6 and C6-7, a superior endplate Schmorl's node at T1, and spurring causing osseous foraminal stenosis on the left at C2-3 and C3-4. Straightening of the cervical lordosis. Aortic and branch vessel atherosclerotic calcification.  No acute cervical spine findings observed.  IMPRESSION: 1. Right forehead scalp hematoma.  No acute intracranial findings. 2. Chronic small lacunar infarcts in both basal ganglia. Periventricular white matter and corona radiata hypodensities favor chronic ischemic microvascular white matter disease. 3. Cervical spondylosis and degenerative disc disease. 4. Atherosclerosis. 5. Remote left medial orbital wall fracture. 6. Chronic right sphenoid sinusitis, with poor cortical definition of the right lateral wall of the sphenoid sinus presumably related to the chronic sinusitis. Given that the sphenoid sinus is not completely opacified, I am doubtful that this represents an acute or urgent situation with the possibility of infection entering the right middle cranial fossa, but if the patient were to exhibit unusual neurologic signs than MRI brain with without contrast might be warranted.   Electronically Signed   By: Van Clines M.D.   On: 12/02/2014 20:27   Ct Head Wo Contrast  11/07/2014   CLINICAL DATA:  Fall with head injury.  Initial encounter.  EXAM: CT HEAD WITHOUT CONTRAST  CT CERVICAL SPINE WITHOUT CONTRAST  TECHNIQUE: Multidetector CT imaging of the head and cervical spine was performed following the standard protocol without intravenous contrast. Multiplanar CT image reconstructions of the cervical spine were also generated.  COMPARISON:  None.  FINDINGS: CT HEAD FINDINGS  Skull and Sinuses:There is opacification the right sphenoid sinus, with large lateral recess. No mucocele as the anterior sphenoid sinus is aerated and the sphenoid sinus ostium is patent. Symmetric appearance of the carotid canals.  Orbits: Bilateral cataract  resection.  No traumatic findings  Brain: No evidence of acute infarction, hemorrhage, hydrocephalus, or mass lesion/mass effect.  There is advanced chronic small vessel disease with ischemic gliosis throughout the deep cerebral white matter. Heterogeneous and indistinct appearance of the bilateral putamen is likely also from chronic hypertension/small-vessel disease. Generalized brain atrophy.  CT CERVICAL SPINE FINDINGS  Negative for acute fracture or subluxation. Irregularity of the superior endplate of T1 is most consistent with a chronic Schmorl's node. No prevertebral edema. No gross cervical canal hematoma.  Diffuse degenerative disc narrowing and spondylotic spurring, most progressed in the mid and lower cervical spine. Moderate, diffuse facet arthropathy with spurring. No evidence for high-grade canal stenosis.  Heavy deposition of calcified atherosclerotic plaque at the carotid bifurcations.  IMPRESSION: 1. No evidence of acute intracranial or cervical spine injury. 2. Brain atrophy and advanced small vessel disease. 3. Chronic right sphenoid sinusitis.   Electronically Signed   By: Angelica Chessman  Watts M.D.   On: 11/07/2014 10:03   Ct Cervical Spine Wo Contrast  12/02/2014   CLINICAL DATA:  Unwitnessed fall. Contusion along the right forehead. Alzheimer dementia. Head injury.  EXAM: CT HEAD WITHOUT CONTRAST  CT CERVICAL SPINE WITHOUT CONTRAST  TECHNIQUE: Multidetector CT imaging of the head and cervical spine was performed following the standard protocol without intravenous contrast. Multiplanar CT image reconstructions of the cervical spine were also generated.  COMPARISON:  11/07/2014  FINDINGS: CT HEAD FINDINGS  Chronic lacunar infarcts an both basal ganglia. Periventricular white matter and corona radiata hypodensities favor chronic ischemic microvascular white matter disease.  Otherwise, The brainstem, cerebellum, cerebral peduncles, thalamus, basal ganglia, basilar cisterns, and ventricular system  appear within normal limits. No intracranial hemorrhage, mass lesion, or acute CVA. Right forehead scalp hematoma noted.  Poor cortical definition of the right lateral wall of the right sphenoid sinus from the middle cranial fossa. Chronic right sphenoid sinusitis. Appearance not significantly changed from 11/07/2014.  Remote left medial orbital wall fracture. There is atherosclerotic calcification of the cavernous carotid arteries bilaterally.  CT CERVICAL SPINE FINDINGS  Multilevel cervical spondylosis and degenerative disc disease with loss of disc height most notable at C5-6 and C6-7, a superior endplate Schmorl's node at T1, and spurring causing osseous foraminal stenosis on the left at C2-3 and C3-4. Straightening of the cervical lordosis. Aortic and branch vessel atherosclerotic calcification.  No acute cervical spine findings observed.  IMPRESSION: 1. Right forehead scalp hematoma.  No acute intracranial findings. 2. Chronic small lacunar infarcts in both basal ganglia. Periventricular white matter and corona radiata hypodensities favor chronic ischemic microvascular white matter disease. 3. Cervical spondylosis and degenerative disc disease. 4. Atherosclerosis. 5. Remote left medial orbital wall fracture. 6. Chronic right sphenoid sinusitis, with poor cortical definition of the right lateral wall of the sphenoid sinus presumably related to the chronic sinusitis. Given that the sphenoid sinus is not completely opacified, I am doubtful that this represents an acute or urgent situation with the possibility of infection entering the right middle cranial fossa, but if the patient were to exhibit unusual neurologic signs than MRI brain with without contrast might be warranted.   Electronically Signed   By: Van Clines M.D.   On: 12/02/2014 20:27   Ct Cervical Spine Wo Contrast  11/07/2014   CLINICAL DATA:  Fall with head injury.  Initial encounter.  EXAM: CT HEAD WITHOUT CONTRAST  CT CERVICAL SPINE  WITHOUT CONTRAST  TECHNIQUE: Multidetector CT imaging of the head and cervical spine was performed following the standard protocol without intravenous contrast. Multiplanar CT image reconstructions of the cervical spine were also generated.  COMPARISON:  None.  FINDINGS: CT HEAD FINDINGS  Skull and Sinuses:There is opacification the right sphenoid sinus, with large lateral recess. No mucocele as the anterior sphenoid sinus is aerated and the sphenoid sinus ostium is patent. Symmetric appearance of the carotid canals.  Orbits: Bilateral cataract resection.  No traumatic findings  Brain: No evidence of acute infarction, hemorrhage, hydrocephalus, or mass lesion/mass effect.  There is advanced chronic small vessel disease with ischemic gliosis throughout the deep cerebral white matter. Heterogeneous and indistinct appearance of the bilateral putamen is likely also from chronic hypertension/small-vessel disease. Generalized brain atrophy.  CT CERVICAL SPINE FINDINGS  Negative for acute fracture or subluxation. Irregularity of the superior endplate of T1 is most consistent with a chronic Schmorl's node. No prevertebral edema. No gross cervical canal hematoma.  Diffuse degenerative disc narrowing and spondylotic spurring, most progressed  in the mid and lower cervical spine. Moderate, diffuse facet arthropathy with spurring. No evidence for high-grade canal stenosis.  Heavy deposition of calcified atherosclerotic plaque at the carotid bifurcations.  IMPRESSION: 1. No evidence of acute intracranial or cervical spine injury. 2. Brain atrophy and advanced small vessel disease. 3. Chronic right sphenoid sinusitis.   Electronically Signed   By: Monte Fantasia M.D.   On: 11/07/2014 10:03    Labs Review Labs Reviewed - No data to display  Imaging Review Ct Head Wo Contrast  12/02/2014   CLINICAL DATA:  Unwitnessed fall. Contusion along the right forehead. Alzheimer dementia. Head injury.  EXAM: CT HEAD WITHOUT CONTRAST   CT CERVICAL SPINE WITHOUT CONTRAST  TECHNIQUE: Multidetector CT imaging of the head and cervical spine was performed following the standard protocol without intravenous contrast. Multiplanar CT image reconstructions of the cervical spine were also generated.  COMPARISON:  11/07/2014  FINDINGS: CT HEAD FINDINGS  Chronic lacunar infarcts an both basal ganglia. Periventricular white matter and corona radiata hypodensities favor chronic ischemic microvascular white matter disease.  Otherwise, The brainstem, cerebellum, cerebral peduncles, thalamus, basal ganglia, basilar cisterns, and ventricular system appear within normal limits. No intracranial hemorrhage, mass lesion, or acute CVA. Right forehead scalp hematoma noted.  Poor cortical definition of the right lateral wall of the right sphenoid sinus from the middle cranial fossa. Chronic right sphenoid sinusitis. Appearance not significantly changed from 11/07/2014.  Remote left medial orbital wall fracture. There is atherosclerotic calcification of the cavernous carotid arteries bilaterally.  CT CERVICAL SPINE FINDINGS  Multilevel cervical spondylosis and degenerative disc disease with loss of disc height most notable at C5-6 and C6-7, a superior endplate Schmorl's node at T1, and spurring causing osseous foraminal stenosis on the left at C2-3 and C3-4. Straightening of the cervical lordosis. Aortic and branch vessel atherosclerotic calcification.  No acute cervical spine findings observed.  IMPRESSION: 1. Right forehead scalp hematoma.  No acute intracranial findings. 2. Chronic small lacunar infarcts in both basal ganglia. Periventricular white matter and corona radiata hypodensities favor chronic ischemic microvascular white matter disease. 3. Cervical spondylosis and degenerative disc disease. 4. Atherosclerosis. 5. Remote left medial orbital wall fracture. 6. Chronic right sphenoid sinusitis, with poor cortical definition of the right lateral wall of the sphenoid  sinus presumably related to the chronic sinusitis. Given that the sphenoid sinus is not completely opacified, I am doubtful that this represents an acute or urgent situation with the possibility of infection entering the right middle cranial fossa, but if the patient were to exhibit unusual neurologic signs than MRI brain with without contrast might be warranted.   Electronically Signed   By: Van Clines M.D.   On: 12/02/2014 20:27   Ct Cervical Spine Wo Contrast  12/02/2014   CLINICAL DATA:  Unwitnessed fall. Contusion along the right forehead. Alzheimer dementia. Head injury.  EXAM: CT HEAD WITHOUT CONTRAST  CT CERVICAL SPINE WITHOUT CONTRAST  TECHNIQUE: Multidetector CT imaging of the head and cervical spine was performed following the standard protocol without intravenous contrast. Multiplanar CT image reconstructions of the cervical spine were also generated.  COMPARISON:  11/07/2014  FINDINGS: CT HEAD FINDINGS  Chronic lacunar infarcts an both basal ganglia. Periventricular white matter and corona radiata hypodensities favor chronic ischemic microvascular white matter disease.  Otherwise, The brainstem, cerebellum, cerebral peduncles, thalamus, basal ganglia, basilar cisterns, and ventricular system appear within normal limits. No intracranial hemorrhage, mass lesion, or acute CVA. Right forehead scalp hematoma noted.  Poor cortical definition  of the right lateral wall of the right sphenoid sinus from the middle cranial fossa. Chronic right sphenoid sinusitis. Appearance not significantly changed from 11/07/2014.  Remote left medial orbital wall fracture. There is atherosclerotic calcification of the cavernous carotid arteries bilaterally.  CT CERVICAL SPINE FINDINGS  Multilevel cervical spondylosis and degenerative disc disease with loss of disc height most notable at C5-6 and C6-7, a superior endplate Schmorl's node at T1, and spurring causing osseous foraminal stenosis on the left at C2-3 and C3-4.  Straightening of the cervical lordosis. Aortic and branch vessel atherosclerotic calcification.  No acute cervical spine findings observed.  IMPRESSION: 1. Right forehead scalp hematoma.  No acute intracranial findings. 2. Chronic small lacunar infarcts in both basal ganglia. Periventricular white matter and corona radiata hypodensities favor chronic ischemic microvascular white matter disease. 3. Cervical spondylosis and degenerative disc disease. 4. Atherosclerosis. 5. Remote left medial orbital wall fracture. 6. Chronic right sphenoid sinusitis, with poor cortical definition of the right lateral wall of the sphenoid sinus presumably related to the chronic sinusitis. Given that the sphenoid sinus is not completely opacified, I am doubtful that this represents an acute or urgent situation with the possibility of infection entering the right middle cranial fossa, but if the patient were to exhibit unusual neurologic signs than MRI brain with without contrast might be warranted.   Electronically Signed   By: Van Clines M.D.   On: 12/02/2014 20:27     EKG Interpretation None      MDM   Final diagnoses:  Fall, initial encounter  Head injury, initial encounter  Hematoma    Medications - No data to display  Filed Vitals:   12/02/14 1735 12/02/14 2030  BP: 139/48 140/57  Pulse: 90 88  Temp: 97.4 F (36.3 C)   TempSrc: Oral   Resp: 20   SpO2: 100% 99%   CT head noted right forehead scalp hematoma. No acute intracranial findings. Chronic small lacunar infarcts in both basal ganglia. Cervical spondylosis including disc disease identified. Remote left medial orbital wall fracture. Patient presenting to the ED with a fall that occurred this afternoon at nursing facility-patient was walking without a walker and hit his head on the railing. Imaging unremarkable for acute intracranial findings. Patient is at baseline - as per nursing facility. Negative focal neurological deficits. Patient  follows commands. Patient seen and assessed by attending physician, Dr. Hampton Abbot, images reviewed with attending - agreed to plan of discharge. Patient stable, afebrile. Patient not septic appearing. Discharged patient. Referred to PCP. Discussed with patient to closely monitor symptoms and if symptoms are to worsen or change to report back to the ED - strict return instructions given.  Patient agreed to plan of care, understood, all questions answered.   Jamse Mead, PA-C 12/02/14 [place], MD 12/04/14 [phone]

## 2014-12-02 NOTE — ED Notes (Signed)
Per [facility] Pt resides [facility]  Per [place] wittnessed fall yet around corner and heard pt fall. Pt yelled out and was alert to norm per staff. Pt was found on floor right side with contusion to right forehead. No other injuries noted. [facility] cleared. No blood thinners. BLS Pt with no other complaints

## 2014-12-02 NOTE — Discharge Instructions (Signed)
Please call your doctor for a followup appointment within 24-48 hours. When you talk to your doctor please let them know that you were seen in the emergency department and have them acquire all of your records so that they can discuss the findings with you and formulate a treatment plan to fully care for your new and ongoing problems. Please follow up with your primary care provider Please rest and stay hydrated Please avoid any physical or strenuous activity  Please rest and stay hydrated Please continue to monitor symptoms closely and if symptoms are to worsen or change (fever greater than 101, chills, sweating, nausea, vomiting, chest pain, shortness of breathe, difficulty breathing, weakness, numbness, tingling, worsening or changes to pain pattern, dizziness, changes to behavior, inability to swallow) please report back to the Emergency Department immediately.    Fall Prevention and Home Safety Falls cause injuries and can affect all age groups. It is possible to use preventive measures to significantly decrease the likelihood of falls. There are many simple measures which can make your home safer and prevent falls. OUTDOORS  Repair cracks and edges of walkways and driveways.  Remove high doorway thresholds.  Trim shrubbery on the main path into your home.  Have good outside lighting.  Clear walkways of tools, rocks, debris, and clutter.  Check that handrails are not broken and are securely fastened. Both sides of steps should have handrails.  Have leaves, snow, and ice cleared regularly.  Use sand or salt on walkways during winter months.  In the garage, clean up grease or oil spills. BATHROOM  Install night lights.  Install grab bars by the toilet and in the tub and shower.  Use non-skid mats or decals in the tub or shower.  Place a plastic non-slip stool in the shower to sit on, if needed.  Keep floors dry and clean up all water on the floor immediately.  Remove soap  buildup in the tub or shower on a regular basis.  Secure bath mats with non-slip, double-sided rug tape.  Remove throw rugs and tripping hazards from the floors. BEDROOMS  Install night lights.  Make sure a bedside light is easy to reach.  Do not use oversized bedding.  Keep a telephone by your bedside.  Have a firm chair with side arms to use for getting dressed.  Remove throw rugs and tripping hazards from the floor. KITCHEN  Keep handles on pots and pans turned toward the center of the stove. Use back burners when possible.  Clean up spills quickly and allow time for drying.  Avoid walking on wet floors.  Avoid hot utensils and knives.  Position shelves so they are not too high or low.  Place commonly used objects within easy reach.  If necessary, use a sturdy step stool with a grab bar when reaching.  Keep electrical cables out of the way.  Do not use floor polish or wax that makes floors slippery. If you must use wax, use non-skid floor wax.  Remove throw rugs and tripping hazards from the floor. STAIRWAYS  Never leave objects on stairs.  Place handrails on both sides of stairways and use them. Fix any loose handrails. Make sure handrails on both sides of the stairways are as long as the stairs.  Check carpeting to make sure it is firmly attached along stairs. Make repairs to worn or loose carpet promptly.  Avoid placing throw rugs at the top or bottom of stairways, or properly secure the rug with carpet tape  to prevent slippage. Get rid of throw rugs, if possible.  Have an electrician put in a light switch at the top and bottom of the stairs. OTHER FALL PREVENTION TIPS  Wear low-heel or rubber-soled shoes that are supportive and fit well. Wear closed toe shoes.  When using a stepladder, make sure it is fully opened and both spreaders are firmly locked. Do not climb a closed stepladder.  Add color or contrast paint or tape to grab bars and handrails in  your home. Place contrasting color strips on first and last steps.  Learn and use mobility aids as needed. Install an electrical emergency response system.  Turn on lights to avoid dark areas. Replace light bulbs that burn out immediately. Get light switches that glow.  Arrange furniture to create clear pathways. Keep furniture in the same place.  Firmly attach carpet with non-skid or double-sided tape.  Eliminate uneven floor surfaces.  Select a carpet pattern that does not visually hide the edge of steps.  Be aware of all pets. OTHER HOME SAFETY TIPS  Set the water temperature for 120 F (48.8 C).  Keep emergency numbers on or near the telephone.  Keep smoke detectors on every level of the home and near sleeping areas. Document Released: 09/04/2002 Document Revised: 03/15/2012 Document Reviewed: 12/04/2011 [facility] Patient Information 2015 [place], [place]. This information is not intended to replace advice given to you by your health care provider. Make sure you discuss any questions you have with your health care provider.  Hematoma A hematoma is a collection of blood under the skin, in an organ, in a body space, in a joint space, or in other tissue. The blood can clot to form a lump that you can see and feel. The lump is often firm and may sometimes become sore and tender. Most hematomas get better in a few days to weeks. However, some hematomas may be serious and require medical care. Hematomas can range in size from very small to very large. CAUSES  A hematoma can be caused by a blunt or penetrating injury. It can also be caused by spontaneous leakage from a blood vessel under the skin. Spontaneous leakage from a blood vessel is more likely to occur in older people, especially those taking blood thinners. Sometimes, a hematoma can develop after certain medical procedures. SIGNS AND SYMPTOMS   A firm lump on the body.  Possible pain and tenderness in the  area.  Bruising.Blue, dark blue, purple-red, or yellowish skin may appear at the site of the hematoma if the hematoma is close to the surface of the skin. For hematomas in deeper tissues or body spaces, the signs and symptoms may be subtle. For example, an intra-abdominal hematoma may cause abdominal pain, weakness, fainting, and shortness of breath. An intracranial hematoma may cause a headache or symptoms such as weakness, trouble speaking, or a change in consciousness. DIAGNOSIS  A hematoma can usually be diagnosed based on your medical history and a physical exam. Imaging tests may be needed if your health care provider suspects a hematoma in deeper tissues or body spaces, such as the abdomen, head, or chest. These tests may include ultrasonography or a CT scan.  TREATMENT  Hematomas usually go away on their own over time. Rarely does the blood need to be drained out of the body. Large hematomas or those that may affect vital organs will sometimes need surgical drainage or monitoring. HOME CARE INSTRUCTIONS   Apply ice to the injured area:   Put  ice in a plastic bag.   Place a towel between your skin and the bag.   Leave the ice on for 20 minutes, 2-3 times a day for the first 1 to 2 days.   After the first 2 days, switch to using warm compresses on the hematoma.   Elevate the injured area to help decrease pain and swelling. Wrapping the area with an elastic bandage may also be helpful. Compression helps to reduce swelling and promotes shrinking of the hematoma. Make sure the bandage is not wrapped too tight.   If your hematoma is on a lower extremity and is painful, crutches may be helpful for a couple days.   Only take over-the-counter or prescription medicines as directed by your health care provider. SEEK IMMEDIATE MEDICAL CARE IF:   You have increasing pain, or your pain is not controlled with medicine.   You have a fever.   You have worsening swelling or  discoloration.   Your skin over the hematoma breaks or starts bleeding.   Your hematoma is in your chest or abdomen and you have weakness, shortness of breath, or a change in consciousness.  Your hematoma is on your scalp (caused by a fall or injury) and you have a worsening headache or a change in alertness or consciousness. MAKE SURE YOU:   Understand these instructions.  Will watch your condition.  Will get help right away if you are not doing well or get worse. Document Released: 04/28/2004 Document Revised: 05/17/2013 Document Reviewed: 02/22/2013 [facility] Patient Information 2015 [place], [place]. This information is not intended to replace advice given to you by your health care provider. Make sure you discuss any questions you have with your health care provider.

## 2014-12-02 NOTE — ED Notes (Signed)
Bed: FU07 Expected date: 12/02/14 Expected time: 5:18 PM Means of arrival: Ambulance Comments: FALL, HEMATOMA TO HEAD

## 2014-12-02 NOTE — ED Notes (Signed)
[facility] to obtain transport to pt back to [facility].  Report given to [place], [place] at [facility].

## 2014-12-03 ENCOUNTER — Emergency Department (HOSPITAL_COMMUNITY): Payer: Medicare Other

## 2014-12-03 ENCOUNTER — Encounter (HOSPITAL_COMMUNITY): Payer: Self-pay | Admitting: *Deleted

## 2014-12-03 ENCOUNTER — Inpatient Hospital Stay (HOSPITAL_COMMUNITY)
Admission: EM | Admit: 2014-12-03 | Discharge: 2014-12-11 | DRG: 442 | Disposition: A | Discharge status: Hospice | Payer: Medicare Other | Attending: Internal Medicine | Admitting: Internal Medicine

## 2014-12-03 DIAGNOSIS — F03918 Unspecified dementia, unspecified severity, with other behavioral disturbance: Secondary | ICD-10-CM | POA: Diagnosis present

## 2014-12-03 DIAGNOSIS — I447 Left bundle-branch block, unspecified: Secondary | ICD-10-CM | POA: Diagnosis present

## 2014-12-03 DIAGNOSIS — F0391 Unspecified dementia with behavioral disturbance: Secondary | ICD-10-CM | POA: Diagnosis not present

## 2014-12-03 DIAGNOSIS — R9431 Abnormal electrocardiogram [ECG] [EKG]: Secondary | ICD-10-CM | POA: Diagnosis present

## 2014-12-03 DIAGNOSIS — R296 Repeated falls: Secondary | ICD-10-CM

## 2014-12-03 DIAGNOSIS — E860 Dehydration: Secondary | ICD-10-CM | POA: Diagnosis present

## 2014-12-03 DIAGNOSIS — J323 Chronic sphenoidal sinusitis: Secondary | ICD-10-CM | POA: Diagnosis present

## 2014-12-03 DIAGNOSIS — I350 Nonrheumatic aortic (valve) stenosis: Secondary | ICD-10-CM | POA: Diagnosis present

## 2014-12-03 DIAGNOSIS — K7460 Unspecified cirrhosis of liver: Secondary | ICD-10-CM | POA: Diagnosis present

## 2014-12-03 DIAGNOSIS — D649 Anemia, unspecified: Secondary | ICD-10-CM | POA: Diagnosis not present

## 2014-12-03 DIAGNOSIS — C61 Malignant neoplasm of prostate: Secondary | ICD-10-CM | POA: Diagnosis present

## 2014-12-03 DIAGNOSIS — J329 Chronic sinusitis, unspecified: Secondary | ICD-10-CM | POA: Diagnosis present

## 2014-12-03 DIAGNOSIS — Z6826 Body mass index (BMI) 26.0-26.9, adult: Secondary | ICD-10-CM

## 2014-12-03 DIAGNOSIS — Z87891 Personal history of nicotine dependence: Secondary | ICD-10-CM | POA: Diagnosis not present

## 2014-12-03 DIAGNOSIS — E440 Moderate protein-calorie malnutrition: Secondary | ICD-10-CM | POA: Diagnosis present

## 2014-12-03 DIAGNOSIS — E870 Hyperosmolality and hypernatremia: Secondary | ICD-10-CM | POA: Diagnosis not present

## 2014-12-03 DIAGNOSIS — Z79899 Other long term (current) drug therapy: Secondary | ICD-10-CM | POA: Diagnosis not present

## 2014-12-03 DIAGNOSIS — K7290 Hepatic failure, unspecified without coma: Secondary | ICD-10-CM | POA: Diagnosis not present

## 2014-12-03 DIAGNOSIS — K7682 Hepatic encephalopathy: Secondary | ICD-10-CM | POA: Diagnosis present

## 2014-12-03 DIAGNOSIS — W19XXXA Unspecified fall, initial encounter: Secondary | ICD-10-CM | POA: Diagnosis present

## 2014-12-03 DIAGNOSIS — L97519 Non-pressure chronic ulcer of other part of right foot with unspecified severity: Secondary | ICD-10-CM | POA: Diagnosis not present

## 2014-12-03 DIAGNOSIS — I4581 Long QT syndrome: Secondary | ICD-10-CM | POA: Diagnosis present

## 2014-12-03 DIAGNOSIS — G9340 Encephalopathy, unspecified: Secondary | ICD-10-CM | POA: Diagnosis not present

## 2014-12-03 DIAGNOSIS — L97529 Non-pressure chronic ulcer of other part of left foot with unspecified severity: Secondary | ICD-10-CM | POA: Diagnosis present

## 2014-12-03 DIAGNOSIS — Z515 Encounter for palliative care: Secondary | ICD-10-CM | POA: Diagnosis not present

## 2014-12-03 DIAGNOSIS — S0003XA Contusion of scalp, initial encounter: Secondary | ICD-10-CM | POA: Diagnosis present

## 2014-12-03 DIAGNOSIS — K7469 Other cirrhosis of liver: Secondary | ICD-10-CM | POA: Diagnosis not present

## 2014-12-03 DIAGNOSIS — Z9181 History of falling: Secondary | ICD-10-CM | POA: Diagnosis not present

## 2014-12-03 DIAGNOSIS — Z79891 Long term (current) use of opiate analgesic: Secondary | ICD-10-CM

## 2014-12-03 DIAGNOSIS — Z7982 Long term (current) use of aspirin: Secondary | ICD-10-CM

## 2014-12-03 DIAGNOSIS — Z66 Do not resuscitate: Secondary | ICD-10-CM

## 2014-12-03 DIAGNOSIS — G309 Alzheimer's disease, unspecified: Secondary | ICD-10-CM | POA: Diagnosis present

## 2014-12-03 DIAGNOSIS — F0280 Dementia in other diseases classified elsewhere without behavioral disturbance: Secondary | ICD-10-CM | POA: Diagnosis present

## 2014-12-03 DIAGNOSIS — F0390 Unspecified dementia without behavioral disturbance: Secondary | ICD-10-CM | POA: Diagnosis not present

## 2014-12-03 DIAGNOSIS — R410 Disorientation, unspecified: Secondary | ICD-10-CM | POA: Diagnosis present

## 2014-12-03 DIAGNOSIS — R7989 Other specified abnormal findings of blood chemistry: Secondary | ICD-10-CM

## 2014-12-03 LAB — URINALYSIS, ROUTINE W REFLEX MICROSCOPIC
Glucose, UA: NEGATIVE mg/dL
KETONES UR: NEGATIVE mg/dL
Leukocytes, UA: NEGATIVE
NITRITE: NEGATIVE
PROTEIN: 100 mg/dL — AB
SPECIFIC GRAVITY, URINE: 1.024 (ref 1.005–1.030)
UROBILINOGEN UA: 1 mg/dL (ref 0.0–1.0)
pH: 6 (ref 5.0–8.0)

## 2014-12-03 LAB — COMPREHENSIVE METABOLIC PANEL
ALK PHOS: 175 U/L — AB (ref 39–117)
ALT: 25 U/L (ref 0–53)
ANION GAP: 7 (ref 5–15)
AST: 44 U/L — AB (ref 0–37)
Albumin: 2.6 g/dL — ABNORMAL LOW (ref 3.5–5.2)
BILIRUBIN TOTAL: 0.6 mg/dL (ref 0.3–1.2)
BUN: 21 mg/dL (ref 6–23)
CHLORIDE: 115 mmol/L — AB (ref 96–112)
CO2: 24 mmol/L (ref 19–32)
Calcium: 8.6 mg/dL (ref 8.4–10.5)
Creatinine, Ser: 1.01 mg/dL (ref 0.50–1.35)
GFR calc Af Amer: 78 mL/min — ABNORMAL LOW (ref >90)
GFR, EST NON AFRICAN AMERICAN: 67 mL/min — AB (ref >90)
GLUCOSE: 108 mg/dL — AB (ref 70–99)
POTASSIUM: 4.2 mmol/L (ref 3.5–5.1)
SODIUM: 146 mmol/L — AB (ref 135–145)
Total Protein: 6.6 g/dL (ref 6.0–8.3)

## 2014-12-03 LAB — CBC WITH DIFFERENTIAL/PLATELET
BASOS ABS: 0 10*3/uL (ref 0.0–0.1)
Basophils Relative: 0 % (ref 0–1)
EOS PCT: 0 % (ref 0–5)
Eosinophils Absolute: 0 10*3/uL (ref 0.0–0.7)
HCT: 26.2 % — ABNORMAL LOW (ref 39.0–52.0)
Hemoglobin: 7.9 g/dL — ABNORMAL LOW (ref 13.0–17.0)
Lymphocytes Relative: 14 % (ref 12–46)
Lymphs Abs: 0.6 10*3/uL — ABNORMAL LOW (ref 0.7–4.0)
MCH: 29.5 pg (ref 26.0–34.0)
MCHC: 30.2 g/dL (ref 30.0–36.0)
MCV: 97.8 fL (ref 78.0–100.0)
MONO ABS: 0.5 10*3/uL (ref 0.1–1.0)
Monocytes Relative: 11 % (ref 3–12)
Neutro Abs: 3.5 10*3/uL (ref 1.7–7.7)
Neutrophils Relative %: 75 % (ref 43–77)
Platelets: 140 10*3/uL — ABNORMAL LOW (ref 150–400)
RBC: 2.68 MIL/uL — ABNORMAL LOW (ref 4.22–5.81)
RDW: 15.5 % (ref 11.5–15.5)
WBC: 4.7 10*3/uL (ref 4.0–10.5)

## 2014-12-03 LAB — URINE MICROSCOPIC-ADD ON

## 2014-12-03 LAB — POC OCCULT BLOOD, ED: FECAL OCCULT BLD: NEGATIVE

## 2014-12-03 LAB — PROTIME-INR
INR: 1.37 (ref 0.00–1.49)
Prothrombin Time: 17 seconds — ABNORMAL HIGH (ref 11.6–15.2)

## 2014-12-03 LAB — AMMONIA: Ammonia: 71 umol/L — ABNORMAL HIGH (ref 11–32)

## 2014-12-03 LAB — ABO/RH: ABO/RH(D): O POS

## 2014-12-03 MED ORDER — SODIUM CHLORIDE 0.9 % IV SOLN
1000.0000 mL | INTRAVENOUS | Status: DC
Start: 2014-12-03 — End: 2014-12-04
  Administered 2014-12-03: 1000 mL via INTRAVENOUS

## 2014-12-03 NOTE — ED Notes (Signed)
Patient transported to X-ray 

## 2014-12-03 NOTE — Progress Notes (Signed)
CSW attempted to speak with pt at bedside. However, the pt was not communicative.   CSW checked with pt's chart, which confirmed that pt is from [facility]. Per note, pt presents to [facility] due to a fall that was not witnessed. Also, note states that pt has a history of dementia.  Willette Brace [phone] ED CSW 12/03/2014 9:47 PM

## 2014-12-03 NOTE — H&P (Signed)
PCP: Sande Brothers, MD    Chief Complaint: Recurrent falls  HPI: Patrick Jefferson is a 81 y.o. male   has a past medical history of Alzheimer's dementia; Liver cirrhosis; and Alzheimer's dementia.   Presented with  Patient currently resides at [facility] he has history of dementia and liver cirrhosis of unclear etiology. Patient has had recurrent falls in the past 3 days he has fallen at least 3 times. Sometimes falls unwitnessed but when they were witnessed patient apparently was ambulating. Patient is unable to provide his history. He was seen in the emergency department yesterday for the fall and had a laceration to the head which was repaired. Patient endorses pain all over but otherwise unable to provide more detailed information. In emergency department he was noted to have hemoglobin of 7.9% clear what his recent baseline is. Last hemoglobin from 2015 was 9.5 he was Hemoccult negative from below. Patient was noted to have elevated ammonia level up to 71. CT scan of the head showed right frontal scalp hematoma and chronic right sphenoid sinusitis was a question whether this possibility infection entering the right middle cranial. Recommendation was to obtain MRI [place] home staff patient has been more agitated. Unable to obtain history from the patient Hospitalist was called for admission for recurrent falls and confusion  Review of Systems:  Unable to obtain due to dementia  Past Medical History: Past Medical History  Diagnosis Date  . Alzheimer's dementia   . Liver cirrhosis   . Alzheimer's dementia    History reviewed. No pertinent past surgical history.   Medications: Prior to Admission medications   Medication Sig Start Date End Date Taking? Authorizing Provider  aspirin 81 MG tablet Take 81 mg by mouth daily.   Yes Historical Provider, MD  bicalutamide (CASODEX) 50 MG tablet Take 50 mg by mouth daily.   Yes Historical Provider, MD  Cholecalciferol (VITAMIN  D-3) 5000 UNITS TABS Take 5,000 Units by mouth daily.    Yes Historical Provider, MD  furosemide (LASIX) 20 MG tablet Take 20 mg by mouth daily.   Yes Historical Provider, MD  haloperidol (HALDOL) 1 MG tablet Take 1 mg by mouth 2 (two) times daily.   Yes Historical Provider, MD  lactulose (CHRONULAC) 10 GM/15ML solution Take 30 g by mouth 2 (two) times daily.    Yes Historical Provider, MD  Multiple Vitamin (THERA/BETA-CAROTENE) TABS Take 1 tablet by mouth daily.   Yes Historical Provider, MD  omeprazole (PRILOSEC) 20 MG capsule Take 20 mg by mouth daily.   Yes Historical Provider, MD  rivastigmine (EXELON) 9.5 mg/24hr Place 9.5 mg onto the skin daily.   Yes Historical Provider, MD  sucralfate (CARAFATE) 1 G tablet Take 1 g by mouth 2 (two) times daily.   Yes Historical Provider, MD  traMADol (ULTRAM) 50 MG tablet Take 50 mg by mouth daily.    Yes Historical Provider, MD  traMADol (ULTRAM) 50 MG tablet Take 50 mg by mouth every 6 (six) hours as needed for moderate pain or severe pain (pain).   Yes Historical Provider, MD  acetaminophen (TYLENOL) 500 MG tablet Take 500 mg by mouth every 4 (four) hours as needed for moderate pain or fever.    Historical Provider, MD  alum & mag hydroxide-simeth (MAALOX/MYLANTA) 200-200-20 MG/5ML suspension Take 30 mLs by mouth every 6 (six) hours as needed for indigestion or heartburn.    Historical Provider, MD  guaiFENesin (ROBITUSSIN) 100 MG/5ML liquid Take 200 mg by mouth every 6 (six)  hours as needed for cough.    Historical Provider, MD  latanoprost (XALATAN) 0.005 % ophthalmic solution Place 1 drop into both eyes at bedtime.    Historical Provider, MD  loperamide (IMODIUM) 2 MG capsule Take 2 mg by mouth daily as needed for diarrhea or loose stools.    Historical Provider, MD  magnesium hydroxide (MILK OF MAGNESIA) 400 MG/5ML suspension Take 30 mLs by mouth daily as needed for mild constipation.    Historical Provider, MD  Melatonin 1 MG TABS Take 1 tablet by  mouth at bedtime.    Historical Provider, MD  neomycin-bacitracin-polymyxin (NEOSPORIN) [phone] ointment Apply 1 application topically daily as needed (for skin tears or abrasions).    Historical Provider, MD    Allergies:  No Known Allergies  Social History:  Ambulatory   independently   From facility [facility]   reports that he does not drink alcohol or use illicit drugs.    Family History: family history is not on file.    Physical Exam: Patient Vitals for the past 24 hrs:  BP Temp Temp src Pulse Resp SpO2  12/03/14 2041 173/92 mmHg - - 97 26 98 %  12/03/14 1652 154/78 mmHg 98 F (36.7 C) Oral 84 18 100 %    1. General:  in No Acute distress 2. Psychological: Alert but not Oriented 3. Head/ENT:    Dry Mucous Membranes                          Head Non traumatic, neck supple                            Poor Dentition 4. SKIN:  decreased Skin turgor,  Skin clean Dry and intact no rash 5. Heart: Regular rate and rhythm no Murmur, Rub or gallop 6. Lungs: Clear to auscultation bilaterally, no wheezes or crackles   7. Abdomen: Soft, non-tender, slightly distended 8. Lower extremities: no clubbing, cyanosis, trace edema, ulceration present on the second toe bilaterally left toe appears to be small about 1 cm x 1 cm ulceration right second toe has large macerated unstageable ulcer 9. Neurologically Grossly intact, moving all 4 extremities equally unable to cooperate with neurological exam 10. MSK: Normal range of motion  body mass index is unknown because there is no weight on file.   Labs on Admission:   Results for orders placed or performed during the hospital encounter of 12/03/14 (from the past 24 hour(s))  ABO/Rh     Status: None   Collection Time: 12/03/14  6:15 PM  Result Value Ref Range   ABO/RH(D) O POS   CBC WITH DIFFERENTIAL     Status: Abnormal   Collection Time: 12/03/14  6:16 PM  Result Value Ref Range   WBC 4.7 4.0 - 10.5 K/uL   RBC 2.68 (L)  4.22 - 5.81 MIL/uL   Hemoglobin 7.9 (L) 13.0 - 17.0 g/dL   HCT 26.2 (L) 39.0 - 52.0 %   MCV 97.8 78.0 - 100.0 fL   MCH 29.5 26.0 - 34.0 pg   MCHC 30.2 30.0 - 36.0 g/dL   RDW 15.5 11.5 - 15.5 %   Platelets 140 (L) 150 - 400 K/uL   Neutrophils Relative % 75 43 - 77 %   Neutro Abs 3.5 1.7 - 7.7 K/uL   Lymphocytes Relative 14 12 - 46 %   Lymphs Abs 0.6 (L) 0.7 - 4.0  K/uL   Monocytes Relative 11 3 - 12 %   Monocytes Absolute 0.5 0.1 - 1.0 K/uL   Eosinophils Relative 0 0 - 5 %   Eosinophils Absolute 0.0 0.0 - 0.7 K/uL   Basophils Relative 0 0 - 1 %   Basophils Absolute 0.0 0.0 - 0.1 K/uL  Comprehensive metabolic panel     Status: Abnormal   Collection Time: 12/03/14  6:16 PM  Result Value Ref Range   Sodium 146 (H) 135 - 145 mmol/L   Potassium 4.2 3.5 - 5.1 mmol/L   Chloride 115 (H) 96 - 112 mmol/L   CO2 24 19 - 32 mmol/L   Glucose, Bld 108 (H) 70 - 99 mg/dL   BUN 21 6 - 23 mg/dL   Creatinine, Ser 1.01 0.50 - 1.35 mg/dL   Calcium 8.6 8.4 - 10.5 mg/dL   Total Protein 6.6 6.0 - 8.3 g/dL   Albumin 2.6 (L) 3.5 - 5.2 g/dL   AST 44 (H) 0 - 37 U/L   ALT 25 0 - 53 U/L   Alkaline Phosphatase 175 (H) 39 - 117 U/L   Total Bilirubin 0.6 0.3 - 1.2 mg/dL   GFR calc non Af Amer 67 (L) >90 mL/min   GFR calc Af Amer 78 (L) >90 mL/min   Anion gap 7 5 - 15  Protime-INR     Status: Abnormal   Collection Time: 12/03/14  6:16 PM  Result Value Ref Range   Prothrombin Time 17.0 (H) 11.6 - 15.2 seconds   INR 1.37 0.00 - 1.49  Type and screen     Status: None   Collection Time: 12/03/14  6:16 PM  Result Value Ref Range   ABO/RH(D) O POS    Antibody Screen NEG    Sample Expiration 12/06/2014   Ammonia     Status: Abnormal   Collection Time: 12/03/14  6:16 PM  Result Value Ref Range   Ammonia 71 (H) 11 - 32 umol/L  Urinalysis, Routine w reflex microscopic     Status: Abnormal   Collection Time: 12/03/14  6:50 PM  Result Value Ref Range   Color, Urine AMBER (A) YELLOW   APPearance CLEAR CLEAR     Specific Gravity, Urine 1.024 1.005 - 1.030   pH 6.0 5.0 - 8.0   Glucose, UA NEGATIVE NEGATIVE mg/dL   Hgb urine dipstick MODERATE (A) NEGATIVE   Bilirubin Urine SMALL (A) NEGATIVE   Ketones, ur NEGATIVE NEGATIVE mg/dL   Protein, ur 100 (A) NEGATIVE mg/dL   Urobilinogen, UA 1.0 0.0 - 1.0 mg/dL   Nitrite NEGATIVE NEGATIVE   Leukocytes, UA NEGATIVE NEGATIVE  Urine microscopic-add on     Status: Abnormal   Collection Time: 12/03/14  6:50 PM  Result Value Ref Range   RBC / HPF 7-10 <3 RBC/hpf   Bacteria, UA RARE RARE   Casts HYALINE CASTS (A) NEGATIVE   Urine-Other MUCOUS PRESENT   POC occult blood, ED RN will collect     Status: None   Collection Time: 12/03/14  8:47 PM  Result Value Ref Range   Fecal Occult Bld NEGATIVE NEGATIVE    UA no evidence of a UTI concentrated  No results found for: HGBA1C  CrCl cannot be calculated (Unknown ideal weight.).  BNP (last 3 results) No results for input(s): PROBNP in the last 8760 hours.  Other results:  I have pearsonaly reviewed this: ECG REPORT  Rate: 90  Rhythm: Sinus rhythm with PACs ST&T Change: No ischemic changes Prolonged  QTc 510  There were no vitals filed for this visit.   Cultures: No results found for: SDES, SPECREQUEST, CULT, REPTSTATUS   Radiological Exams on Admission: Dg Chest 1 View  12/03/2014   CLINICAL DATA:  Unwitnessed fall.  EXAM: CHEST  1 VIEW  COMPARISON:  None.  FINDINGS: The aorta is tortuous. The heart size is normal. The mediastinal contour is normal. There is no focal infiltrate, pulmonary edema, or pleural effusion. Degenerative joint changes of both shoulders are identified. The bones are otherwise unremarkable. The visualized skeletal structures are unremarkable.  IMPRESSION: No active cardiopulmonary disease.   Electronically Signed   By: Abelardo Diesel M.D.   On: 12/03/2014 18:22   Dg Lumbar Spine Complete  12/03/2014   CLINICAL DATA:  Status post fall today. Low back pain. Initial encounter.   EXAM: LUMBAR SPINE - COMPLETE 4+ VIEW  COMPARISON:  Plain films lumbar spine 11/07/2014.  FINDINGS: No fracture or malalignment is identified. Multilevel spondylosis is seen, unchanged. Extensive atherosclerosis is noted.  IMPRESSION: No acute abnormality.   Electronically Signed   By: Inge Rise M.D.   On: 12/03/2014 18:24   Ct Head Wo Contrast  12/03/2014   CLINICAL DATA:  81 year old male with history of trauma from a fall. Dementia. Combative patient.  EXAM: CT HEAD WITHOUT CONTRAST  CT CERVICAL SPINE WITHOUT CONTRAST  TECHNIQUE: Multidetector CT imaging of the head and cervical spine was performed following the standard protocol without intravenous contrast. Multiplanar CT image reconstructions of the cervical spine were also generated.  COMPARISON:  Head and cervical spine CT 12/02/2014.  FINDINGS: CT HEAD FINDINGS  Large right frontal scalp hematoma is slightly smaller than yesterday's examination no underlying displaced skull fracture. Moderate cerebral and mild cerebellar atrophy. Patchy and confluent areas of decreased attenuation are noted throughout the deep and periventricular white matter of the cerebral hemispheres bilaterally, compatible with chronic microvascular ischemic disease. No acute intracranial abnormalities. Specifically, no signs of acute posttraumatic intracranial hemorrhage, no mass, mass effect, hydrocephalus or abnormal intra or extra-axial fluid collections. Mastoids are well pneumatized. Paranasal sinuses are generally well pneumatized, with exception of extensive mucoperiosteal thickening in the right sphenoid sinus, with extensive cortical thinning in the lateral wall of the right sphenoid sinus, similar to the prior examination.  CT CERVICAL SPINE FINDINGS  No acute displaced fracture of the cervical spine. 3 mm of anterolisthesis of C4 on C5 appears to be chronic and is unchanged. Reversal of normal cervical lordosis centered at the level of C5 also unchanged and  presumably chronic. Alignment is otherwise anatomic. Prevertebral soft tissues are normal. Severe multilevel degenerative disc disease, most pronounced at C5-C6, C6-C7 and C7-T1. Multilevel facet arthropathy. Mild paraseptal emphysema in the lung apices.  IMPRESSION: 1. Slight decreased size of large right frontal scalp hematoma. No signs of new trauma to the skull, brain or cervical spine. 2. Moderate cerebral and mild cerebellar atrophy with extensive chronic microvascular ischemic changes in the cerebral white matter. 3. Severe multilevel degenerative disc disease and cervical spondylosis redemonstrated, as above. 4. Chronic right sphenoid sinusitis redemonstrated. As noted on the prior examination, there is very poor delineation of the cortex of the lateral wall of the right sphenoid sinus, without definite fluid collection in the right middle cranial fossa to suggest frank intracranial extension of sinus disease.   Electronically Signed   By: Vinnie Langton M.D.   On: 12/03/2014 18:38   Ct Head Wo Contrast  12/02/2014   CLINICAL DATA:  Unwitnessed fall. Contusion  along the right forehead. Alzheimer dementia. Head injury.  EXAM: CT HEAD WITHOUT CONTRAST  CT CERVICAL SPINE WITHOUT CONTRAST  TECHNIQUE: Multidetector CT imaging of the head and cervical spine was performed following the standard protocol without intravenous contrast. Multiplanar CT image reconstructions of the cervical spine were also generated.  COMPARISON:  11/07/2014  FINDINGS: CT HEAD FINDINGS  Chronic lacunar infarcts an both basal ganglia. Periventricular white matter and corona radiata hypodensities favor chronic ischemic microvascular white matter disease.  Otherwise, The brainstem, cerebellum, cerebral peduncles, thalamus, basal ganglia, basilar cisterns, and ventricular system appear within normal limits. No intracranial hemorrhage, mass lesion, or acute CVA. Right forehead scalp hematoma noted.  Poor cortical definition of the right  lateral wall of the right sphenoid sinus from the middle cranial fossa. Chronic right sphenoid sinusitis. Appearance not significantly changed from 11/07/2014.  Remote left medial orbital wall fracture. There is atherosclerotic calcification of the cavernous carotid arteries bilaterally.  CT CERVICAL SPINE FINDINGS  Multilevel cervical spondylosis and degenerative disc disease with loss of disc height most notable at C5-6 and C6-7, a superior endplate Schmorl's node at T1, and spurring causing osseous foraminal stenosis on the left at C2-3 and C3-4. Straightening of the cervical lordosis. Aortic and branch vessel atherosclerotic calcification.  No acute cervical spine findings observed.  IMPRESSION: 1. Right forehead scalp hematoma.  No acute intracranial findings. 2. Chronic small lacunar infarcts in both basal ganglia. Periventricular white matter and corona radiata hypodensities favor chronic ischemic microvascular white matter disease. 3. Cervical spondylosis and degenerative disc disease. 4. Atherosclerosis. 5. Remote left medial orbital wall fracture. 6. Chronic right sphenoid sinusitis, with poor cortical definition of the right lateral wall of the sphenoid sinus presumably related to the chronic sinusitis. Given that the sphenoid sinus is not completely opacified, I am doubtful that this represents an acute or urgent situation with the possibility of infection entering the right middle cranial fossa, but if the patient were to exhibit unusual neurologic signs than MRI brain with without contrast might be warranted.   Electronically Signed   By: Van Clines M.D.   On: 12/02/2014 20:27   Ct Cervical Spine Wo Contrast  12/03/2014   CLINICAL DATA:  81 year old male with history of trauma from a fall. Dementia. Combative patient.  EXAM: CT HEAD WITHOUT CONTRAST  CT CERVICAL SPINE WITHOUT CONTRAST  TECHNIQUE: Multidetector CT imaging of the head and cervical spine was performed following the standard  protocol without intravenous contrast. Multiplanar CT image reconstructions of the cervical spine were also generated.  COMPARISON:  Head and cervical spine CT 12/02/2014.  FINDINGS: CT HEAD FINDINGS  Large right frontal scalp hematoma is slightly smaller than yesterday's examination no underlying displaced skull fracture. Moderate cerebral and mild cerebellar atrophy. Patchy and confluent areas of decreased attenuation are noted throughout the deep and periventricular white matter of the cerebral hemispheres bilaterally, compatible with chronic microvascular ischemic disease. No acute intracranial abnormalities. Specifically, no signs of acute posttraumatic intracranial hemorrhage, no mass, mass effect, hydrocephalus or abnormal intra or extra-axial fluid collections. Mastoids are well pneumatized. Paranasal sinuses are generally well pneumatized, with exception of extensive mucoperiosteal thickening in the right sphenoid sinus, with extensive cortical thinning in the lateral wall of the right sphenoid sinus, similar to the prior examination.  CT CERVICAL SPINE FINDINGS  No acute displaced fracture of the cervical spine. 3 mm of anterolisthesis of C4 on C5 appears to be chronic and is unchanged. Reversal of normal cervical lordosis centered at the level of  C5 also unchanged and presumably chronic. Alignment is otherwise anatomic. Prevertebral soft tissues are normal. Severe multilevel degenerative disc disease, most pronounced at C5-C6, C6-C7 and C7-T1. Multilevel facet arthropathy. Mild paraseptal emphysema in the lung apices.  IMPRESSION: 1. Slight decreased size of large right frontal scalp hematoma. No signs of new trauma to the skull, brain or cervical spine. 2. Moderate cerebral and mild cerebellar atrophy with extensive chronic microvascular ischemic changes in the cerebral white matter. 3. Severe multilevel degenerative disc disease and cervical spondylosis redemonstrated, as above. 4. Chronic right  sphenoid sinusitis redemonstrated. As noted on the prior examination, there is very poor delineation of the cortex of the lateral wall of the right sphenoid sinus, without definite fluid collection in the right middle cranial fossa to suggest frank intracranial extension of sinus disease.   Electronically Signed   By: Vinnie Langton M.D.   On: 12/03/2014 18:38   Ct Cervical Spine Wo Contrast  12/02/2014   CLINICAL DATA:  Unwitnessed fall. Contusion along the right forehead. Alzheimer dementia. Head injury.  EXAM: CT HEAD WITHOUT CONTRAST  CT CERVICAL SPINE WITHOUT CONTRAST  TECHNIQUE: Multidetector CT imaging of the head and cervical spine was performed following the standard protocol without intravenous contrast. Multiplanar CT image reconstructions of the cervical spine were also generated.  COMPARISON:  11/07/2014  FINDINGS: CT HEAD FINDINGS  Chronic lacunar infarcts an both basal ganglia. Periventricular white matter and corona radiata hypodensities favor chronic ischemic microvascular white matter disease.  Otherwise, The brainstem, cerebellum, cerebral peduncles, thalamus, basal ganglia, basilar cisterns, and ventricular system appear within normal limits. No intracranial hemorrhage, mass lesion, or acute CVA. Right forehead scalp hematoma noted.  Poor cortical definition of the right lateral wall of the right sphenoid sinus from the middle cranial fossa. Chronic right sphenoid sinusitis. Appearance not significantly changed from 11/07/2014.  Remote left medial orbital wall fracture. There is atherosclerotic calcification of the cavernous carotid arteries bilaterally.  CT CERVICAL SPINE FINDINGS  Multilevel cervical spondylosis and degenerative disc disease with loss of disc height most notable at C5-6 and C6-7, a superior endplate Schmorl's node at T1, and spurring causing osseous foraminal stenosis on the left at C2-3 and C3-4. Straightening of the cervical lordosis. Aortic and branch vessel  atherosclerotic calcification.  No acute cervical spine findings observed.  IMPRESSION: 1. Right forehead scalp hematoma.  No acute intracranial findings. 2. Chronic small lacunar infarcts in both basal ganglia. Periventricular white matter and corona radiata hypodensities favor chronic ischemic microvascular white matter disease. 3. Cervical spondylosis and degenerative disc disease. 4. Atherosclerosis. 5. Remote left medial orbital wall fracture. 6. Chronic right sphenoid sinusitis, with poor cortical definition of the right lateral wall of the sphenoid sinus presumably related to the chronic sinusitis. Given that the sphenoid sinus is not completely opacified, I am doubtful that this represents an acute or urgent situation with the possibility of infection entering the right middle cranial fossa, but if the patient were to exhibit unusual neurologic signs than MRI brain with without contrast might be warranted.   Electronically Signed   By: Van Clines M.D.   On: 12/02/2014 20:27   Dg Hips Bilat With Pelvis 3-4 Views  12/03/2014   CLINICAL DATA:  Status post fall. Bilateral hip pain. Initial encounter.  EXAM: BILATERAL HIP (WITH PELVIS) 3-4 VIEWS  COMPARISON:  None.  FINDINGS: No acute bony or joint abnormality is identified. No notable degenerative change is seen about the hips. Extensive atherosclerosis is noted.  IMPRESSION: No acute abnormality.  Electronically Signed   By: Inge Rise M.D.   On: 12/03/2014 18:23    Chart has been reviewed  Assessment/Plan  81 year old male with history of cirrhosis unclear etiology presents from assisted living with repeated falls over the past week. CT of the head repeatedly showing chronic sinusitis. On the labs he was noted to be anemic with hemoglobin down to 7.5 Hemoccult negative from below. Patient is being admitted for father workup. He was noted to have elevated ammonia levels suggestive of hepatic encephalopathy  Present on Admission:  .  Hepatic encephalopathy versus other encephalopathy. Patient appears to be dehydrated. Slightly elevated sodium. After 146. Has history of dementia and baseline. We'll increase lactulose continued to monitor repeat ammonia level tomorrow. Given CT scan findings of chronic sinusitis could not rule out possible intracranial involvement will obtain MRI to father evaluate. Questionable chronic sinusitis. We'll obtain MRI to father evaluate for his any intracranial spread. Patient has no fever no white blood cell count. Holding off on antibiotics for now  . Dementia chronic stable stop haloperidol given prolonged QTC  . Anemia patient has history of anemia in the past unclear etiology will obtain anemia panel Hemoccult stool first Hemoccult negative . Cirrhosis unclear etiology also obtain hepatis panel  . Prolonged Q-T interval on ECG -undetermined telemetry hold QTC prolonging medication  . Dehydration and Lasix give gentle IV fluids Second toe ulceration. We will obtain plain imaging to evaluate posterior. Given severity patient will likely need extensive for performed care and possibly surgical consult  Prophylaxis: SCD  , Protonix  CODE STATUS:  presumed to be FULL CODE need to discuss this case with family currently not at bedside  Other plan as per orders.  I have spent a total of 55 min on this admission  Zelena Bushong 12/03/2014, 10:06 PM  [facility]  Pager [phone]   after 2 AM please page floor coverage PA If 7AM-7PM, please contact the day team taking care of the patient  [email or web address]  Password [email or web address]

## 2014-12-03 NOTE — ED Notes (Signed)
Bed: WA03 Expected date:  Expected time:  Means of arrival:  Comments: EMS FALL

## 2014-12-03 NOTE — ED Notes (Signed)
Deferred vital signs, when going to place blood pressure cuff around patients arm, he starts hitting and yelling.

## 2014-12-03 NOTE — ED Notes (Addendum)
Per EMS they were contacted today by representative of [facility] d/t patient fall. hx dementia. Per staff fall was not witnessed, this will make the third fall in 3 days.  Pt was unable to complete PT today d/t agitation. He currently has sutures to forehead are from last night after a fall.

## 2014-12-03 NOTE — ED Provider Notes (Signed)
CSN: [id number]     Arrival date & time 12/03/14  1641 History   First MD Initiated Contact with Patient 12/03/14 1722     Chief Complaint  Patient presents with  . Fall   level V caveat:  HPI Patient presents to the emergency room after having a fall at his nursing facility. This was not witnessed.  History is limited because the patient cannot tell me why he is here   according to the EMS report he has fallen 3 times last few days. He was supposed to complete physical therapy today but could not because he was agitated. The patient was seen in the emergency room yesterday after similar episode. He had a scan of his head and neck. He had a laceration of his forehead sutured. The patient cannot tell you what is bothering him. He states I don't feel good. I hurt all over.  Past Medical History  Diagnosis Date  . Alzheimer's dementia   . Liver cirrhosis   . Alzheimer's dementia    History reviewed. No pertinent past surgical history. History reviewed. No pertinent family history. History  Substance Use Topics  . Smoking status: Unknown If Ever Smoked  . Smokeless tobacco: Not on file  . Alcohol Use: No    Review of Systems  Unable to perform ROS: Dementia      Allergies  Review of patient's allergies indicates no known allergies.  Home Medications   Prior to Admission medications   Medication Sig Start Date End Date Taking? Authorizing Provider  aspirin 81 MG tablet Take 81 mg by mouth daily.   Yes Historical Provider, MD  bicalutamide (CASODEX) 50 MG tablet Take 50 mg by mouth daily.   Yes Historical Provider, MD  Cholecalciferol (VITAMIN D-3) 5000 UNITS TABS Take 5,000 Units by mouth daily.    Yes Historical Provider, MD  furosemide (LASIX) 20 MG tablet Take 20 mg by mouth daily.   Yes Historical Provider, MD  haloperidol (HALDOL) 1 MG tablet Take 1 mg by mouth 2 (two) times daily.   Yes Historical Provider, MD  lactulose (CHRONULAC) 10 GM/15ML solution Take 30 g by mouth 2  (two) times daily.    Yes Historical Provider, MD  Multiple Vitamin (THERA/BETA-CAROTENE) TABS Take 1 tablet by mouth daily.   Yes Historical Provider, MD  omeprazole (PRILOSEC) 20 MG capsule Take 20 mg by mouth daily.   Yes Historical Provider, MD  rivastigmine (EXELON) 9.5 mg/24hr Place 9.5 mg onto the skin daily.   Yes Historical Provider, MD  sucralfate (CARAFATE) 1 G tablet Take 1 g by mouth 2 (two) times daily.   Yes Historical Provider, MD  traMADol (ULTRAM) 50 MG tablet Take 50 mg by mouth daily.    Yes Historical Provider, MD  traMADol (ULTRAM) 50 MG tablet Take 50 mg by mouth every 6 (six) hours as needed for moderate pain or severe pain (pain).   Yes Historical Provider, MD  acetaminophen (TYLENOL) 500 MG tablet Take 500 mg by mouth every 4 (four) hours as needed for moderate pain or fever.    Historical Provider, MD  alum & mag hydroxide-simeth (MAALOX/MYLANTA) 200-200-20 MG/5ML suspension Take 30 mLs by mouth every 6 (six) hours as needed for indigestion or heartburn.    Historical Provider, MD  guaiFENesin (ROBITUSSIN) 100 MG/5ML liquid Take 200 mg by mouth every 6 (six) hours as needed for cough.    Historical Provider, MD  latanoprost (XALATAN) 0.005 % ophthalmic solution Place 1 drop into both eyes at  bedtime.    Historical Provider, MD  loperamide (IMODIUM) 2 MG capsule Take 2 mg by mouth daily as needed for diarrhea or loose stools.    Historical Provider, MD  magnesium hydroxide (MILK OF MAGNESIA) 400 MG/5ML suspension Take 30 mLs by mouth daily as needed for mild constipation.    Historical Provider, MD  Melatonin 1 MG TABS Take 1 tablet by mouth at bedtime.    Historical Provider, MD  neomycin-bacitracin-polymyxin (NEOSPORIN) [id number] ointment Apply 1 application topically daily as needed (for skin tears or abrasions).    Historical Provider, MD   BP 173/92 mmHg  Pulse 97  Temp(Src) 98 F (36.7 C) (Oral)  Resp 26  SpO2 98% Physical Exam  Constitutional: No distress.   Frail, elderly  HENT:  Head: Normocephalic.  Right Ear: External ear normal.  Left Ear: External ear normal.  Mouth/Throat: No oropharyngeal exudate.  Healing contusion right forehead, mucous membranes are dry  Eyes: Conjunctivae are normal. Right eye exhibits no discharge. Left eye exhibits no discharge. No scleral icterus.  Neck: Neck supple. No tracheal deviation present.  No cervical spine tenderness  Cardiovascular: Normal rate, regular rhythm and intact distal pulses.   Pulmonary/Chest: Effort normal and breath sounds normal. No stridor. No respiratory distress. He has no wheezes. He has no rales.  Abdominal: Soft. Bowel sounds are normal. He exhibits no distension. There is no tenderness. There is no rebound and no guarding.  Musculoskeletal: He exhibits edema. He exhibits no tenderness.  Edema bilateral lower extremities, no focal areas of tenderness or injury noted in all 4 extremities  Neurological: He is alert. No cranial nerve deficit (no facial droop, extraocular movements intact, no slurred speech) or sensory deficit. He exhibits normal muscle tone. He displays no seizure activity.  Patient has weakness in all extremities but he does have difficulty holding both legs off the bed. Greater however in his right lower extremity, it does immediately fall to the bed the patient will not comply and told his hands up in the air. I cannot really assess whether he has any decreased strength or does seem to have increased tone on the right side  Skin: Skin is warm. No rash noted. He is not diaphoretic.  Psychiatric: He has a normal mood and affect.  Nursing note and vitals reviewed.   ED Course  Procedures (including critical care time) Labs Review Labs Reviewed  CBC WITH DIFFERENTIAL/PLATELET - Abnormal; Notable for the following:    RBC 2.68 (*)    Hemoglobin 7.9 (*)    HCT 26.2 (*)    Platelets 140 (*)    Lymphs Abs 0.6 (*)    All other components within normal limits   COMPREHENSIVE METABOLIC PANEL - Abnormal; Notable for the following:    Sodium 146 (*)    Chloride 115 (*)    Glucose, Bld 108 (*)    Albumin 2.6 (*)    AST 44 (*)    Alkaline Phosphatase 175 (*)    GFR calc non Af Amer 67 (*)    GFR calc Af Amer 78 (*)    All other components within normal limits  PROTIME-INR - Abnormal; Notable for the following:    Prothrombin Time 17.0 (*)    All other components within normal limits  URINALYSIS, ROUTINE W REFLEX MICROSCOPIC - Abnormal; Notable for the following:    Color, Urine AMBER (*)    Hgb urine dipstick MODERATE (*)    Bilirubin Urine SMALL (*)    Protein, ur  100 (*)    All other components within normal limits  AMMONIA - Abnormal; Notable for the following:    Ammonia 71 (*)    All other components within normal limits  URINE MICROSCOPIC-ADD ON - Abnormal; Notable for the following:    Casts HYALINE CASTS (*)    All other components within normal limits  POC OCCULT BLOOD, ED  TYPE AND SCREEN  ABO/RH    Imaging Review Dg Chest 1 View  12/03/2014   CLINICAL DATA:  Unwitnessed fall.  EXAM: CHEST  1 VIEW  COMPARISON:  None.  FINDINGS: The aorta is tortuous. The heart size is normal. The mediastinal contour is normal. There is no focal infiltrate, pulmonary edema, or pleural effusion. Degenerative joint changes of both shoulders are identified. The bones are otherwise unremarkable. The visualized skeletal structures are unremarkable.  IMPRESSION: No active cardiopulmonary disease.   Electronically Signed   By: Abelardo Diesel M.D.   On: 12/03/2014 18:22   Dg Lumbar Spine Complete  12/03/2014   CLINICAL DATA:  Status post fall today. Low back pain. Initial encounter.  EXAM: LUMBAR SPINE - COMPLETE 4+ VIEW  COMPARISON:  Plain films lumbar spine 11/07/2014.  FINDINGS: No fracture or malalignment is identified. Multilevel spondylosis is seen, unchanged. Extensive atherosclerosis is noted.  IMPRESSION: No acute abnormality.   Electronically Signed    By: Inge Rise M.D.   On: 12/03/2014 18:24   Ct Head Wo Contrast  12/03/2014   CLINICAL DATA:  81 year old male with history of trauma from a fall. Dementia. Combative patient.  EXAM: CT HEAD WITHOUT CONTRAST  CT CERVICAL SPINE WITHOUT CONTRAST  TECHNIQUE: Multidetector CT imaging of the head and cervical spine was performed following the standard protocol without intravenous contrast. Multiplanar CT image reconstructions of the cervical spine were also generated.  COMPARISON:  Head and cervical spine CT 12/02/2014.  FINDINGS: CT HEAD FINDINGS  Large right frontal scalp hematoma is slightly smaller than yesterday's examination no underlying displaced skull fracture. Moderate cerebral and mild cerebellar atrophy. Patchy and confluent areas of decreased attenuation are noted throughout the deep and periventricular white matter of the cerebral hemispheres bilaterally, compatible with chronic microvascular ischemic disease. No acute intracranial abnormalities. Specifically, no signs of acute posttraumatic intracranial hemorrhage, no mass, mass effect, hydrocephalus or abnormal intra or extra-axial fluid collections. Mastoids are well pneumatized. Paranasal sinuses are generally well pneumatized, with exception of extensive mucoperiosteal thickening in the right sphenoid sinus, with extensive cortical thinning in the lateral wall of the right sphenoid sinus, similar to the prior examination.  CT CERVICAL SPINE FINDINGS  No acute displaced fracture of the cervical spine. 3 mm of anterolisthesis of C4 on C5 appears to be chronic and is unchanged. Reversal of normal cervical lordosis centered at the level of C5 also unchanged and presumably chronic. Alignment is otherwise anatomic. Prevertebral soft tissues are normal. Severe multilevel degenerative disc disease, most pronounced at C5-C6, C6-C7 and C7-T1. Multilevel facet arthropathy. Mild paraseptal emphysema in the lung apices.  IMPRESSION: 1. Slight decreased  size of large right frontal scalp hematoma. No signs of new trauma to the skull, brain or cervical spine. 2. Moderate cerebral and mild cerebellar atrophy with extensive chronic microvascular ischemic changes in the cerebral white matter. 3. Severe multilevel degenerative disc disease and cervical spondylosis redemonstrated, as above. 4. Chronic right sphenoid sinusitis redemonstrated. As noted on the prior examination, there is very poor delineation of the cortex of the lateral wall of the right sphenoid sinus, without definite  fluid collection in the right middle cranial fossa to suggest frank intracranial extension of sinus disease.   Electronically Signed   By: Vinnie Langton M.D.   On: 12/03/2014 18:38   Ct Head Wo Contrast  12/02/2014   CLINICAL DATA:  Unwitnessed fall. Contusion along the right forehead. Alzheimer dementia. Head injury.  EXAM: CT HEAD WITHOUT CONTRAST  CT CERVICAL SPINE WITHOUT CONTRAST  TECHNIQUE: Multidetector CT imaging of the head and cervical spine was performed following the standard protocol without intravenous contrast. Multiplanar CT image reconstructions of the cervical spine were also generated.  COMPARISON:  11/07/2014  FINDINGS: CT HEAD FINDINGS  Chronic lacunar infarcts an both basal ganglia. Periventricular white matter and corona radiata hypodensities favor chronic ischemic microvascular white matter disease.  Otherwise, The brainstem, cerebellum, cerebral peduncles, thalamus, basal ganglia, basilar cisterns, and ventricular system appear within normal limits. No intracranial hemorrhage, mass lesion, or acute CVA. Right forehead scalp hematoma noted.  Poor cortical definition of the right lateral wall of the right sphenoid sinus from the middle cranial fossa. Chronic right sphenoid sinusitis. Appearance not significantly changed from 11/07/2014.  Remote left medial orbital wall fracture. There is atherosclerotic calcification of the cavernous carotid arteries bilaterally.   CT CERVICAL SPINE FINDINGS  Multilevel cervical spondylosis and degenerative disc disease with loss of disc height most notable at C5-6 and C6-7, a superior endplate Schmorl's node at T1, and spurring causing osseous foraminal stenosis on the left at C2-3 and C3-4. Straightening of the cervical lordosis. Aortic and branch vessel atherosclerotic calcification.  No acute cervical spine findings observed.  IMPRESSION: 1. Right forehead scalp hematoma.  No acute intracranial findings. 2. Chronic small lacunar infarcts in both basal ganglia. Periventricular white matter and corona radiata hypodensities favor chronic ischemic microvascular white matter disease. 3. Cervical spondylosis and degenerative disc disease. 4. Atherosclerosis. 5. Remote left medial orbital wall fracture. 6. Chronic right sphenoid sinusitis, with poor cortical definition of the right lateral wall of the sphenoid sinus presumably related to the chronic sinusitis. Given that the sphenoid sinus is not completely opacified, I am doubtful that this represents an acute or urgent situation with the possibility of infection entering the right middle cranial fossa, but if the patient were to exhibit unusual neurologic signs than MRI brain with without contrast might be warranted.   Electronically Signed   By: Van Clines M.D.   On: 12/02/2014 20:27   Ct Cervical Spine Wo Contrast  12/03/2014   CLINICAL DATA:  81 year old male with history of trauma from a fall. Dementia. Combative patient.  EXAM: CT HEAD WITHOUT CONTRAST  CT CERVICAL SPINE WITHOUT CONTRAST  TECHNIQUE: Multidetector CT imaging of the head and cervical spine was performed following the standard protocol without intravenous contrast. Multiplanar CT image reconstructions of the cervical spine were also generated.  COMPARISON:  Head and cervical spine CT 12/02/2014.  FINDINGS: CT HEAD FINDINGS  Large right frontal scalp hematoma is slightly smaller than yesterday's examination no  underlying displaced skull fracture. Moderate cerebral and mild cerebellar atrophy. Patchy and confluent areas of decreased attenuation are noted throughout the deep and periventricular white matter of the cerebral hemispheres bilaterally, compatible with chronic microvascular ischemic disease. No acute intracranial abnormalities. Specifically, no signs of acute posttraumatic intracranial hemorrhage, no mass, mass effect, hydrocephalus or abnormal intra or extra-axial fluid collections. Mastoids are well pneumatized. Paranasal sinuses are generally well pneumatized, with exception of extensive mucoperiosteal thickening in the right sphenoid sinus, with extensive cortical thinning in the lateral wall of  the right sphenoid sinus, similar to the prior examination.  CT CERVICAL SPINE FINDINGS  No acute displaced fracture of the cervical spine. 3 mm of anterolisthesis of C4 on C5 appears to be chronic and is unchanged. Reversal of normal cervical lordosis centered at the level of C5 also unchanged and presumably chronic. Alignment is otherwise anatomic. Prevertebral soft tissues are normal. Severe multilevel degenerative disc disease, most pronounced at C5-C6, C6-C7 and C7-T1. Multilevel facet arthropathy. Mild paraseptal emphysema in the lung apices.  IMPRESSION: 1. Slight decreased size of large right frontal scalp hematoma. No signs of new trauma to the skull, brain or cervical spine. 2. Moderate cerebral and mild cerebellar atrophy with extensive chronic microvascular ischemic changes in the cerebral white matter. 3. Severe multilevel degenerative disc disease and cervical spondylosis redemonstrated, as above. 4. Chronic right sphenoid sinusitis redemonstrated. As noted on the prior examination, there is very poor delineation of the cortex of the lateral wall of the right sphenoid sinus, without definite fluid collection in the right middle cranial fossa to suggest frank intracranial extension of sinus disease.    Electronically Signed   By: Vinnie Langton M.D.   On: 12/03/2014 18:38   Ct Cervical Spine Wo Contrast  12/02/2014   CLINICAL DATA:  Unwitnessed fall. Contusion along the right forehead. Alzheimer dementia. Head injury.  EXAM: CT HEAD WITHOUT CONTRAST  CT CERVICAL SPINE WITHOUT CONTRAST  TECHNIQUE: Multidetector CT imaging of the head and cervical spine was performed following the standard protocol without intravenous contrast. Multiplanar CT image reconstructions of the cervical spine were also generated.  COMPARISON:  11/07/2014  FINDINGS: CT HEAD FINDINGS  Chronic lacunar infarcts an both basal ganglia. Periventricular white matter and corona radiata hypodensities favor chronic ischemic microvascular white matter disease.  Otherwise, The brainstem, cerebellum, cerebral peduncles, thalamus, basal ganglia, basilar cisterns, and ventricular system appear within normal limits. No intracranial hemorrhage, mass lesion, or acute CVA. Right forehead scalp hematoma noted.  Poor cortical definition of the right lateral wall of the right sphenoid sinus from the middle cranial fossa. Chronic right sphenoid sinusitis. Appearance not significantly changed from 11/07/2014.  Remote left medial orbital wall fracture. There is atherosclerotic calcification of the cavernous carotid arteries bilaterally.  CT CERVICAL SPINE FINDINGS  Multilevel cervical spondylosis and degenerative disc disease with loss of disc height most notable at C5-6 and C6-7, a superior endplate Schmorl's node at T1, and spurring causing osseous foraminal stenosis on the left at C2-3 and C3-4. Straightening of the cervical lordosis. Aortic and branch vessel atherosclerotic calcification.  No acute cervical spine findings observed.  IMPRESSION: 1. Right forehead scalp hematoma.  No acute intracranial findings. 2. Chronic small lacunar infarcts in both basal ganglia. Periventricular white matter and corona radiata hypodensities favor chronic ischemic  microvascular white matter disease. 3. Cervical spondylosis and degenerative disc disease. 4. Atherosclerosis. 5. Remote left medial orbital wall fracture. 6. Chronic right sphenoid sinusitis, with poor cortical definition of the right lateral wall of the sphenoid sinus presumably related to the chronic sinusitis. Given that the sphenoid sinus is not completely opacified, I am doubtful that this represents an acute or urgent situation with the possibility of infection entering the right middle cranial fossa, but if the patient were to exhibit unusual neurologic signs than MRI brain with without contrast might be warranted.   Electronically Signed   By: Van Clines M.D.   On: 12/02/2014 20:27   Dg Hips Bilat With Pelvis 3-4 Views  12/03/2014   CLINICAL DATA:  Status post fall. Bilateral hip pain. Initial encounter.  EXAM: BILATERAL HIP (WITH PELVIS) 3-4 VIEWS  COMPARISON:  None.  FINDINGS: No acute bony or joint abnormality is identified. No notable degenerative change is seen about the hips. Extensive atherosclerosis is noted.  IMPRESSION: No acute abnormality.   Electronically Signed   By: Inge Rise M.D.   On: 12/03/2014 18:23     MDM   Final diagnoses:  Fall  Increased ammonia level  Anemia, unspecified anemia type    Pt has fallen twice now in the last two days.  Pt has history of dementia but labs show increased ammonia level.  Pt is also anemic.  Increased from previous values but no blood noted in the stool.  MRI may be useful to evaluate further for stroke considering the recurrent falls and the limitations of his neuro exam.  Will consult with medical service for admission    Dorie Rank, MD 12/03/14 2146

## 2014-12-03 NOTE — Progress Notes (Signed)
Clinical Social Work Department BRIEF PSYCHOSOCIAL ASSESSMENT 12/03/2014  Patient:  Patrick Jefferson, Patrick Jefferson     Account Number:  [id number]     Admit date:  12/03/2014  Clinical Social Worker:  Tilda Burrow, CLINICAL SOCIAL WORKER  Date/Time:  12/03/2014 10:48 PM  Referred by:  CSW  Date Referred:  12/03/2014 Referred for  Other - See comment   Other Referral:   Interview type:  Patient Other interview type:    PSYCHOSOCIAL DATA Living Status:  FACILITY Admitted from facility:   Level of care:  Assisted Living Primary support name:  Uknown Primary support relationship to patient:   Degree of support available:   Unkown. There was no family present at bedside. Also, pt was not communicative.    CURRENT CONCERNS Current Concerns  Adjustment to Illness   Other Concerns:    SOCIAL WORK ASSESSMENT / PLAN CSW attempted to speak with pt at bedside. However, the pt was not communicative.    CSW checked with pt's chart, which confirmed that pt is from [facility]. Per note, pt presents to [facility] due to a fall that was not witnessed. Also, note states that pt has a history of dementia.    Per note, pt presented to ED yesterday due to similar episode. Note also states, that pt was unable to tell staff what was bothering him.   Assessment/plan status:  No Further Intervention Required Other assessment/ plan:   Information/referral to community resources:   Pt is already a resident at a local ALF, [facility].    PATIENT'S/FAMILY'S RESPONSE TO PLAN OF CARE: There was no family present. Pt was not able to communicate.      Willette Brace [phone] ED CSW 12/03/2014 10:56 PM

## 2014-12-04 ENCOUNTER — Inpatient Hospital Stay (HOSPITAL_COMMUNITY): Payer: Medicare Other

## 2014-12-04 ENCOUNTER — Encounter (HOSPITAL_COMMUNITY): Payer: Self-pay | Admitting: *Deleted

## 2014-12-04 DIAGNOSIS — F0390 Unspecified dementia without behavioral disturbance: Secondary | ICD-10-CM

## 2014-12-04 DIAGNOSIS — K7469 Other cirrhosis of liver: Secondary | ICD-10-CM

## 2014-12-04 DIAGNOSIS — R296 Repeated falls: Secondary | ICD-10-CM

## 2014-12-04 DIAGNOSIS — I4581 Long QT syndrome: Secondary | ICD-10-CM

## 2014-12-04 LAB — CBC
HCT: 22.1 % — ABNORMAL LOW (ref 39.0–52.0)
Hemoglobin: 6.9 g/dL — CL (ref 13.0–17.0)
MCH: 30.4 pg (ref 26.0–34.0)
MCHC: 31.2 g/dL (ref 30.0–36.0)
MCV: 97.4 fL (ref 78.0–100.0)
Platelets: 115 10*3/uL — ABNORMAL LOW (ref 150–400)
RBC: 2.27 MIL/uL — ABNORMAL LOW (ref 4.22–5.81)
RDW: 15.5 % (ref 11.5–15.5)
WBC: 3.8 10*3/uL — ABNORMAL LOW (ref 4.0–10.5)

## 2014-12-04 LAB — COMPREHENSIVE METABOLIC PANEL
ALT: 22 U/L (ref 0–53)
ANION GAP: 6 (ref 5–15)
AST: 35 U/L (ref 0–37)
Albumin: 2.3 g/dL — ABNORMAL LOW (ref 3.5–5.2)
Alkaline Phosphatase: 140 U/L — ABNORMAL HIGH (ref 39–117)
BUN: 22 mg/dL (ref 6–23)
CALCIUM: 8.3 mg/dL — AB (ref 8.4–10.5)
CO2: 25 mmol/L (ref 19–32)
CREATININE: 0.97 mg/dL (ref 0.50–1.35)
Chloride: 116 mmol/L — ABNORMAL HIGH (ref 96–112)
GFR calc non Af Amer: 75 mL/min — ABNORMAL LOW (ref >90)
GFR, EST AFRICAN AMERICAN: 87 mL/min — AB (ref >90)
GLUCOSE: 91 mg/dL (ref 70–99)
Potassium: 3.6 mmol/L (ref 3.5–5.1)
Sodium: 147 mmol/L — ABNORMAL HIGH (ref 135–145)
Total Bilirubin: 0.7 mg/dL (ref 0.3–1.2)
Total Protein: 5.8 g/dL — ABNORMAL LOW (ref 6.0–8.3)

## 2014-12-04 LAB — TYPE AND SCREEN
ABO/RH(D): O POS
Antibody Screen: NEGATIVE
Unit division: 0

## 2014-12-04 LAB — MRSA PCR SCREENING: MRSA by PCR: POSITIVE — AB

## 2014-12-04 LAB — OCCULT BLOOD X 1 CARD TO LAB, STOOL: Fecal Occult Bld: NEGATIVE

## 2014-12-04 LAB — FOLATE: Folate: 17.7 ng/mL

## 2014-12-04 LAB — PHOSPHORUS: Phosphorus: 3.3 mg/dL (ref 2.3–4.6)

## 2014-12-04 LAB — PREALBUMIN: PREALBUMIN: 4.8 mg/dL — AB (ref 17.0–34.0)

## 2014-12-04 LAB — HEPATITIS PANEL, ACUTE
HCV Ab: NEGATIVE
HEP B S AG: NEGATIVE
Hep A IgM: NONREACTIVE
Hep B C IgM: NONREACTIVE

## 2014-12-04 LAB — TSH: TSH: 1.364 u[IU]/mL (ref 0.350–4.500)

## 2014-12-04 LAB — PREPARE RBC (CROSSMATCH)

## 2014-12-04 LAB — MAGNESIUM: MAGNESIUM: 1.9 mg/dL (ref 1.5–2.5)

## 2014-12-04 LAB — RETICULOCYTES
RBC.: 2.27 MIL/uL — AB (ref 4.22–5.81)
RETIC CT PCT: 1.4 % (ref 0.4–3.1)
Retic Count, Absolute: 31.8 10*3/uL (ref 19.0–186.0)

## 2014-12-04 LAB — IRON AND TIBC
Iron: 16 ug/dL — ABNORMAL LOW (ref 42–165)
SATURATION RATIOS: 12 % — AB (ref 20–55)
TIBC: 138 ug/dL — AB (ref 215–435)
UIBC: 122 ug/dL — ABNORMAL LOW (ref 125–400)

## 2014-12-04 LAB — FERRITIN: FERRITIN: 616 ng/mL — AB (ref 22–322)

## 2014-12-04 LAB — AMMONIA: Ammonia: 20 umol/L (ref 11–32)

## 2014-12-04 LAB — VITAMIN B12: Vitamin B-12: 513 pg/mL (ref 211–911)

## 2014-12-04 MED ORDER — FUROSEMIDE 10 MG/ML IJ SOLN
20.0000 mg | Freq: Once | INTRAMUSCULAR | Status: AC
  Administered 2014-12-04: 20 mg via INTRAVENOUS
  Filled 2014-12-04: qty 2

## 2014-12-04 MED ORDER — CHLORHEXIDINE GLUCONATE CLOTH 2 % EX PADS
6.0000 | MEDICATED_PAD | Freq: Every day | CUTANEOUS | Status: AC
  Administered 2014-12-04 – 2014-12-08 (×5): 6 via TOPICAL

## 2014-12-04 MED ORDER — SODIUM CHLORIDE 0.9 % IJ SOLN
3.0000 mL | Freq: Two times a day (BID) | INTRAMUSCULAR | Status: DC
  Administered 2014-12-04 – 2014-12-09 (×2): 3 mL via INTRAVENOUS

## 2014-12-04 MED ORDER — SODIUM CHLORIDE 0.9 % IV SOLN
Freq: Once | INTRAVENOUS | Status: AC
  Administered 2014-12-04: 10:00:00 via INTRAVENOUS

## 2014-12-04 MED ORDER — ACETAMINOPHEN 650 MG RE SUPP: 650.0000 mg | Freq: Four times a day (QID) | RECTAL | Status: DC | PRN

## 2014-12-04 MED ORDER — SODIUM CHLORIDE 0.9 % IV SOLN
Freq: Once | INTRAVENOUS | Status: AC
Start: 2014-12-04 — End: 2014-12-06
  Administered 2014-12-06: 04:00:00 via INTRAVENOUS

## 2014-12-04 MED ORDER — RIVASTIGMINE 9.5 MG/24HR TD PT24
9.5000 mg | MEDICATED_PATCH | Freq: Every day | TRANSDERMAL | Status: DC
  Administered 2014-12-04 – 2014-12-11 (×8): 9.5 mg via TRANSDERMAL
  Filled 2014-12-04 (×10): qty 1

## 2014-12-04 MED ORDER — LATANOPROST 0.005 % OP SOLN
1.0000 [drp] | Freq: Every day | OPHTHALMIC | Status: DC
  Administered 2014-12-04 – 2014-12-10 (×5): 1 [drp] via OPHTHALMIC
  Filled 2014-12-04: qty 2.5

## 2014-12-04 MED ORDER — ENSURE COMPLETE PO LIQD
237.0000 mL | Freq: Two times a day (BID) | ORAL | Status: DC
  Administered 2014-12-05 – 2014-12-11 (×9): 237 mL via ORAL

## 2014-12-04 MED ORDER — SODIUM CHLORIDE 0.9 % IV SOLN
1000.0000 mL | INTRAVENOUS | Status: DC
Start: 2014-12-04 — End: 2014-12-06
  Administered 2014-12-04 – 2014-12-06 (×5): 1000 mL via INTRAVENOUS

## 2014-12-04 MED ORDER — HYDROCODONE-ACETAMINOPHEN 5-325 MG PO TABS
1.0000 | ORAL_TABLET | ORAL | Status: DC | PRN
  Administered 2014-12-06: 1 via ORAL
  Filled 2014-12-04: qty 1

## 2014-12-04 MED ORDER — SODIUM CHLORIDE 0.9 % IV SOLN: 1000.0000 mL | INTRAVENOUS | Status: DC

## 2014-12-04 MED ORDER — GUAIFENESIN 100 MG/5ML PO SOLN: 200.0000 mg | Freq: Four times a day (QID) | ORAL | Status: DC | PRN

## 2014-12-04 MED ORDER — MUPIROCIN 2 % EX OINT
1.0000 "application " | TOPICAL_OINTMENT | Freq: Two times a day (BID) | CUTANEOUS | Status: AC
  Administered 2014-12-04 – 2014-12-08 (×8): 1 via NASAL
  Filled 2014-12-04 (×3): qty 22

## 2014-12-04 MED ORDER — LORAZEPAM 2 MG/ML IJ SOLN
1.0000 mg | Freq: Once | INTRAMUSCULAR | Status: AC
  Administered 2014-12-04: 1 mg via INTRAVENOUS
  Filled 2014-12-04: qty 1

## 2014-12-04 MED ORDER — ACETAMINOPHEN 325 MG PO TABS
650.0000 mg | ORAL_TABLET | Freq: Four times a day (QID) | ORAL | Status: DC | PRN
  Administered 2014-12-04: 650 mg via ORAL
  Filled 2014-12-04: qty 2

## 2014-12-04 MED ORDER — PANTOPRAZOLE SODIUM 40 MG PO TBEC
40.0000 mg | DELAYED_RELEASE_TABLET | Freq: Every day | ORAL | Status: DC
  Administered 2014-12-04 – 2014-12-11 (×8): 40 mg via ORAL
  Filled 2014-12-04 (×9): qty 1

## 2014-12-04 MED ORDER — SUCRALFATE 1 G PO TABS
1.0000 g | ORAL_TABLET | Freq: Two times a day (BID) | ORAL | Status: DC
  Administered 2014-12-04 – 2014-12-08 (×7): 1 g via ORAL
  Filled 2014-12-04 (×13): qty 1

## 2014-12-04 MED ORDER — ASPIRIN EC 81 MG PO TBEC
81.0000 mg | DELAYED_RELEASE_TABLET | Freq: Every day | ORAL | Status: DC
  Administered 2014-12-04 – 2014-12-08 (×5): 81 mg via ORAL
  Filled 2014-12-04 (×5): qty 1

## 2014-12-04 MED ORDER — LACTULOSE 10 GM/15ML PO SOLN
30.0000 g | Freq: Three times a day (TID) | ORAL | Status: DC
  Administered 2014-12-04 – 2014-12-11 (×20): 30 g via ORAL
  Filled 2014-12-04 (×23): qty 60

## 2014-12-04 NOTE — Progress Notes (Signed)
INITIAL NUTRITION ASSESSMENT  DOCUMENTATION CODES Per approved criteria  -Non-severe (moderate) malnutrition in the context of chronic illness  Pt meets criteria for moderate  MALNUTRITION in the context of chronic illness as evidenced by moderate muscle depletion and 8% weight loss x 3 weeks.  INTERVENTION: Continue Ensure Complete po BID, each supplement provides 350 kcal and 13 grams of protein Provide Magic cup TID with meals, each supplement provides 290 kcal and 9 grams of protein RD to continue to monitor for PO intake  NUTRITION DIAGNOSIS: Increased nutrient needs related to cirrhosis as evidenced by estimated nutritional needs.   Goal: Pt to meet >/= 90% of their estimated nutrition needs   Monitor:  PO and supplemental intake, weight, labs, I/O's  Reason for Assessment: Consult for nutritional assessment  Admitting Dx: Recurrent falls  ASSESSMENT: 81 y.o. male has a past medical history of Alzheimer's dementia; Liver cirrhosis; and Alzheimer's dementia. Patient has had recurrent falls in the past 3 days he has fallen at least 3 times.  Pt lying in bed during RD visit. Pt is unable to provide history. No family present in room.  Per weight history, pt has lost 16 lb since 2/15 (8% weight loss x 3 weeks, significant for time frame).  Pt followed by SLP, recommended Dysphagia 2 diet with low sodium restrictions. Diet ordered today. RD to monitor for PO intake. Pt refusing Ensure supplements. RD to order Magic cup TID.  Pt with noticeable moderate depletion in temporal and clavicle regions.   Labs reviewed: Elevated Na  Height: Ht Readings from Last 1 Encounters:  12/04/14 5\' 10"  (1.778 m)    Weight: Wt Readings from Last 1 Encounters:  12/04/14 184 lb 8.4 oz (83.7 kg)    Ideal Body Weight: 166 lb  % Ideal Body Weight: 111%  Wt Readings from Last 10 Encounters:  12/04/14 184 lb 8.4 oz (83.7 kg)  11/12/14 200 lb (90.719 kg)    Usual Body Weight: unable  to obtain from pt  % Usual Body Weight: "  BMI:  Body mass index is 26.48 kg/(m^2).  Estimated Nutritional Needs: Kcal: 2000-2100 Protein: 110-120g Fluid: 2L/day  Skin: leg abrasion, open ulcer on toe  Diet Order: DIET DYS 2  EDUCATION NEEDS: -No education needs identified at this time   Intake/Output Summary (Last 24 hours) at 12/04/14 1002 Last data filed at 12/04/14 0731  Gross per 24 hour  Intake 1356.25 ml  Output    200 ml  Net 1156.25 ml    Last BM: unknown  Labs:   Recent Labs Lab 12/03/14 1816 12/04/14 0510  NA 146* 147*  K 4.2 3.6  CL 115* 116*  CO2 24 25  BUN 21 22  CREATININE 1.01 0.97  CALCIUM 8.6 8.3*  MG  --  1.9  PHOS  --  3.3  GLUCOSE 108* 91    CBG (last 3)  No results for input(s): GLUCAP in the last 72 hours.  Scheduled Meds: . sodium chloride   Intravenous Once  . aspirin EC  81 mg Oral Daily  . Chlorhexidine Gluconate Cloth  6 each Topical Q0600  . feeding supplement (ENSURE COMPLETE)  237 mL Oral BID BM  . lactulose  30 g Oral TID  . latanoprost  1 drop Both Eyes QHS  . LORazepam  1 mg Intravenous Once  . mupirocin ointment  1 application Nasal BID  . pantoprazole  40 mg Oral Daily  . rivastigmine  9.5 mg Transdermal Daily  . sodium chloride  3 mL Intravenous Q12H  . sucralfate  1 g Oral BID    Continuous Infusions: . sodium chloride 1,000 mL (12/04/14 0215)    Past Medical History  Diagnosis Date  . Alzheimer's dementia   . Liver cirrhosis   . Alzheimer's dementia     History reviewed. No pertinent past surgical history.  Clayton Bibles, MS, RD, LDN Pager: [phone] After Hours Pager: [phone]

## 2014-12-04 NOTE — Progress Notes (Signed)
SLP Cancellation Note  Patient Details Name: Patrick Jefferson MRN: [id number] DOB: [birth year]   Cancelled treatment:       Reason Eval/Treat Not Completed: Other (comment) (Pt unable to participate in com/cog evaluation. Cognitive deficits at baseline.). BSE completed. Recommend dys 2/thin liquid diet, crushed meds. ST to follow for diet tolerance.  Neidra Girvan B. Quentin Ore [facility], CCC-SLP [phone]  Shonna Chock 12/04/2014, 9:59 AM

## 2014-12-04 NOTE — Consult Note (Signed)
WOC wound consult note Reason for Consult:Second digits on bilateral feet with anterior ulcers Wound type:traumatic vs. PAD Pressure Ulcer POA: No Measurement:Right measures 1.5 x 2cm with dry eschar vs dried serum covering wound depth.  Left measures 1cm round x 0.2cm deep. Wound VZC:HYIFO serum vs dried eschar Drainage (amount, consistency, odor) None Periwound:intact, dry. Dressing procedure/placement/frequency:Patients wounds will be cleansed, treated with an antiseptic and allowed to air dry prior to covering with a dry dressing. Nursing staff will elevate heels off of bed surface to prevent pressure injuries to the heels. [place] nursing team will not follow, but will remain available to this patient, the nursing and medical team.  Please re-consult if needed. Thanks, Maudie Flakes, MSN, RN, [place], [place], [place] [phone])

## 2014-12-04 NOTE — Progress Notes (Addendum)
Patient admitted from the ED and was very combative.  Uncooperative with staff and would not communicate when questions asked.  Completed as much as was able to on health history.  Will continue to monitor patient and keep safe from injury.

## 2014-12-04 NOTE — Progress Notes (Signed)
CRITICAL VALUE ALERT  Critical value received:  Hgb 6.9  Date of notification:  12/04/2014  Time of notification:  05:36  Critical value read back:Yes.    Nurse who received alert:  Ruben Im, RN  MD notified (1st page):  K Schorr  Time of first page:  06:05  MD notified (2nd page):  Time of second page:  Responding MD:  Lamar Blinks  Time MD responded:  06:10

## 2014-12-04 NOTE — Evaluation (Signed)
Clinical/Bedside Swallow Evaluation Patient Details  Name: Patrick Jefferson MRN: [id number] Date of Birth: June 25, 1930  Today's Date: 12/04/2014 Time: SLP Start Time (ACUTE ONLY): 0930 SLP Stop Time (ACUTE ONLY): 0945 SLP Time Calculation (min) (ACUTE ONLY): 15 min  Past Medical History:  Past Medical History  Diagnosis Date  . Alzheimer's dementia   . Liver cirrhosis   . Alzheimer's dementia    Past Surgical History: History reviewed. No pertinent past surgical history. HPI:  81 year old admitted 12/03/14 due to recurrent falls. PMH significant for dementia, liver cirrhosis. Pt found to have elevated ammonia levels, suggestive of hepatic encephalopathy. BSE ordered tp determine least restrictive diet.   Assessment / Plan / Recommendation Clinical Impression  Minimal oral care provided due to pt uncooperativeness. Oral cavity noted to be dry, missing dentition. Limited evaluation due to uncooperativeness, however, pt oral strength and function appear adequate. No overt s/s aspiration on thin or puree consistencies. Pt refused cracker, but was observed to chew ice chip sufficiently. At this time, recommend dys 2 diet with thin liquids, meds crushed in sweet puree consistency. ST to follow to assess diet tolerance and appropriateness to advance to mechanical soft diet.     Aspiration Risk  Mild    Diet Recommendation Dysphagia 2 (Fine chop);Thin liquid   Liquid Administration via: Cup;Straw Medication Administration: Crushed with puree Supervision: Full supervision/cueing for compensatory strategies;Staff to assist with self feeding Compensations: Slow rate;Small sips/bites;Check for pocketing;Follow solids with liquid Postural Changes and/or Swallow Maneuvers: Seated upright 90 degrees;Upright 30-60 min after meal    Other  Recommendations Oral Care Recommendations: Oral care BID;Staff/trained caregiver to provide oral care Other Recommendations:  (suction may facilitate oral care due to pt  advanced cognitive impairment)   Follow Up Recommendations  24 hour supervision/assistance;Skilled Nursing facility    Frequency and Duration min 1 x/week  1 week   Pertinent Vitals/Pain No pain evident    SLP Swallow Goals  diet tolerance   Swallow Study Prior Functional Status   Unknown. No prior intervention by SLP found.    General Date of Onset: 12/03/14 HPI: 81 year old admitted 12/03/14 due to recurrent falls. PMH significant for dementia, liver cirrhosis. Pt found to have elevated ammonia levels, suggestive of hepatic encephalopathy. BSE ordered tp determine least restrictive diet. Type of Study: Bedside swallow evaluation Previous Swallow Assessment: none found Diet Prior to this Study: Regular;Thin liquids Temperature Spikes Noted: No Respiratory Status: Room air History of Recent Intubation: No Behavior/Cognition: Alert;Uncooperative;Doesn't follow directions;Decreased sustained attention Oral Cavity - Dentition: Missing dentition Self-Feeding Abilities: Total assist Patient Positioning: Upright in bed Baseline Vocal Quality: Clear Volitional Cough: Cognitively unable to elicit Volitional Swallow: Unable to elicit    Oral/Motor/Sensory Function Overall Oral Motor/Sensory Function: Appears within functional limits for tasks assessed   Ice Chips Ice chips: Within functional limits Presentation: Spoon   Thin Liquid Thin Liquid: Within functional limits Presentation: Straw    Nectar Thick Nectar Thick Liquid: Not tested   Honey Thick Honey Thick Liquid: Not tested   Puree Puree: Within functional limits Presentation: [place] B. Quentin Ore [facility], [place] [phone] Solid: Not tested Other Comments: pt refused       Shonna Chock 12/04/2014,9:56 AM

## 2014-12-04 NOTE — Progress Notes (Signed)
Attempted to see pt.  Pt very confused and combative.  Pt's hemoglobin is 6.9.  Will defer until tomorrow. Jinger Neighbors, [place] [phone]

## 2014-12-04 NOTE — Progress Notes (Signed)
[facility] PROGRESS NOTE  Patrick Jefferson SAY:[id number] DOB: Sep 28, 1930 DOA: 12/03/2014 PCP: Sande Brothers, MD  Assessment/Plan: 1. Hepatic encephalopathy -Patient with history of cirrhosis presenting with worsening confusion, recurrent falls, initial lab work showing elevated ammonia level of 71 -After the administration of lactulose when he level coming down to 20. -He does appear to have an obvious source of infection, chest x-ray showed no acute cardiopulmonary disease, urinalysis unremarkable, plain film of foot did not show evidence of osteomyelitis. -Patient remained afebrile overnight, has white count of 3800.  2.  Acute on chronic anemia -Patient having a decrease in his hemoglobin from 7.9-6.9 overnight, unclear etiology. -He was Hemoccult negative in the emergency department, does not have evidence of GI bleed.  -He has been typed and crossed and will receive 2 units of packed red blood cells today -Plan to repeat a stool for guaiac. Check iron studies, ferritin, folate and B12  3.  Advanced dementia -Patient presenting with acute agitation suspect hepatic encephalopathy -Holding antipsychotic therapy given prolonged QTC, initial EKG showed QTC of 510  4.  Prolonged QTC -Initial EKG showed prolonged QTC of 510 -Will repeat EKG today  Code Status: Full code Family Communication: I called relative Laretta Alstrom, no answer, left voicemail.  Disposition Plan: Anticipate discharge to skilled nursing facility when medically stable    HPI/Subjective: Patient is an 81 year old gentleman, nursing home patient, with history of advanced dementia, cirrhosis, transfer from his skilled nursing facility to [facility] on 12/03/2014 presenting with functional decline, recurrent falls, increased confusion. Initial workup included ammonia level that came back negative at 71. It was suspected that hepatic encephalopathy precipitated agitation. He was administered lactulose with  ammonia levels trending down to 20 following day. Patient also found to be anemic with a downward trend in his hemoglobin from 7.9-6.9. He was typed and crossed and transfused 2 units of packed red blood cells. He does not have evidence of GI bleed and was found to be Hemoccult negative in the emergency department. By the following day he remains encephalopathic, becoming agitated at times.  Objective: Filed Vitals:   12/04/14 1245  BP: 128/43  Pulse: 88  Temp: 98.7 F (37.1 C)  Resp: 20    Intake/Output Summary (Last 24 hours) at 12/04/14 1248 Last data filed at 12/04/14 0731  Gross per 24 hour  Intake 1356.25 ml  Output    200 ml  Net 1156.25 ml   Filed Weights   12/04/14 0012  Weight: 83.7 kg (184 lb 8.4 oz)    Exam:   General:  Patient is arousable, confused, disoriented, having difficulty even with simple commands  Cardiovascular: Regular rate and rhythm normal S1-S2 no murmurs rubs or gallops  Respiratory: Normal respiratory effort, breathing comfortably on room air  Abdomen: Soft nontender nondistended positive bowel sounds  Musculoskeletal: No edema  Data Reviewed: Basic Metabolic Panel:  Recent Labs Lab 12/03/14 1816 12/04/14 0510  NA 146* 147*  K 4.2 3.6  CL 115* 116*  CO2 24 25  GLUCOSE 108* 91  BUN 21 22  CREATININE 1.01 0.97  CALCIUM 8.6 8.3*  MG  --  1.9  PHOS  --  3.3   Liver Function Tests:  Recent Labs Lab 12/03/14 1816 12/04/14 0510  AST 44* 35  ALT 25 22  ALKPHOS 175* 140*  BILITOT 0.6 0.7  PROT 6.6 5.8*  ALBUMIN 2.6* 2.3*   No results for input(s): LIPASE, AMYLASE in the last 168 hours.  Recent Labs Lab 12/03/14 1816 12/04/14  0510  AMMONIA 71* 20   CBC:  Recent Labs Lab 12/03/14 1816 12/04/14 0510  WBC 4.7 3.8*  NEUTROABS 3.5  --   HGB 7.9* 6.9*  HCT 26.2* 22.1*  MCV 97.8 97.4  PLT 140* 115*   Cardiac Enzymes: No results for input(s): CKTOTAL, CKMB, CKMBINDEX, TROPONINI in the last 168 hours. BNP (last 3  results) No results for input(s): BNP in the last 8760 hours.  ProBNP (last 3 results) No results for input(s): PROBNP in the last 8760 hours.  CBG: No results for input(s): GLUCAP in the last 168 hours.  Recent Results (from the past 240 hour(s))  MRSA PCR Screening     Status: Abnormal   Collection Time: 12/04/14 12:32 AM  Result Value Ref Range Status   MRSA by PCR POSITIVE (A) NEGATIVE Final    Comment:        The GeneXpert MRSA Assay (FDA approved for NASAL specimens only), is one component of a comprehensive MRSA colonization surveillance program. It is not intended to diagnose MRSA infection nor to guide or monitor treatment for MRSA infections. RESULT CALLED TO, READ BACK BY AND VERIFIED WITH: D. BROWN RN AT 0430 ON 03.08.16 BY SHUEA      Studies: Dg Chest 1 View  12/03/2014   CLINICAL DATA:  Unwitnessed fall.  EXAM: CHEST  1 VIEW  COMPARISON:  None.  FINDINGS: The aorta is tortuous. The heart size is normal. The mediastinal contour is normal. There is no focal infiltrate, pulmonary edema, or pleural effusion. Degenerative joint changes of both shoulders are identified. The bones are otherwise unremarkable. The visualized skeletal structures are unremarkable.  IMPRESSION: No active cardiopulmonary disease.   Electronically Signed   By: Abelardo Diesel M.D.   On: 12/03/2014 18:22   Dg Lumbar Spine Complete  12/03/2014   CLINICAL DATA:  Status post fall today. Low back pain. Initial encounter.  EXAM: LUMBAR SPINE - COMPLETE 4+ VIEW  COMPARISON:  Plain films lumbar spine 11/07/2014.  FINDINGS: No fracture or malalignment is identified. Multilevel spondylosis is seen, unchanged. Extensive atherosclerosis is noted.  IMPRESSION: No acute abnormality.   Electronically Signed   By: Inge Rise M.D.   On: 12/03/2014 18:24   Ct Head Wo Contrast  12/03/2014   CLINICAL DATA:  81 year old male with history of trauma from a fall. Dementia. Combative patient.  EXAM: CT HEAD WITHOUT  CONTRAST  CT CERVICAL SPINE WITHOUT CONTRAST  TECHNIQUE: Multidetector CT imaging of the head and cervical spine was performed following the standard protocol without intravenous contrast. Multiplanar CT image reconstructions of the cervical spine were also generated.  COMPARISON:  Head and cervical spine CT 12/02/2014.  FINDINGS: CT HEAD FINDINGS  Large right frontal scalp hematoma is slightly smaller than yesterday's examination no underlying displaced skull fracture. Moderate cerebral and mild cerebellar atrophy. Patchy and confluent areas of decreased attenuation are noted throughout the deep and periventricular white matter of the cerebral hemispheres bilaterally, compatible with chronic microvascular ischemic disease. No acute intracranial abnormalities. Specifically, no signs of acute posttraumatic intracranial hemorrhage, no mass, mass effect, hydrocephalus or abnormal intra or extra-axial fluid collections. Mastoids are well pneumatized. Paranasal sinuses are generally well pneumatized, with exception of extensive mucoperiosteal thickening in the right sphenoid sinus, with extensive cortical thinning in the lateral wall of the right sphenoid sinus, similar to the prior examination.  CT CERVICAL SPINE FINDINGS  No acute displaced fracture of the cervical spine. 3 mm of anterolisthesis of C4 on C5 appears to be  chronic and is unchanged. Reversal of normal cervical lordosis centered at the level of C5 also unchanged and presumably chronic. Alignment is otherwise anatomic. Prevertebral soft tissues are normal. Severe multilevel degenerative disc disease, most pronounced at C5-C6, C6-C7 and C7-T1. Multilevel facet arthropathy. Mild paraseptal emphysema in the lung apices.  IMPRESSION: 1. Slight decreased size of large right frontal scalp hematoma. No signs of new trauma to the skull, brain or cervical spine. 2. Moderate cerebral and mild cerebellar atrophy with extensive chronic microvascular ischemic changes in  the cerebral white matter. 3. Severe multilevel degenerative disc disease and cervical spondylosis redemonstrated, as above. 4. Chronic right sphenoid sinusitis redemonstrated. As noted on the prior examination, there is very poor delineation of the cortex of the lateral wall of the right sphenoid sinus, without definite fluid collection in the right middle cranial fossa to suggest frank intracranial extension of sinus disease.   Electronically Signed   By: Vinnie Langton M.D.   On: 12/03/2014 18:38   Ct Head Wo Contrast  12/02/2014   CLINICAL DATA:  Unwitnessed fall. Contusion along the right forehead. Alzheimer dementia. Head injury.  EXAM: CT HEAD WITHOUT CONTRAST  CT CERVICAL SPINE WITHOUT CONTRAST  TECHNIQUE: Multidetector CT imaging of the head and cervical spine was performed following the standard protocol without intravenous contrast. Multiplanar CT image reconstructions of the cervical spine were also generated.  COMPARISON:  11/07/2014  FINDINGS: CT HEAD FINDINGS  Chronic lacunar infarcts an both basal ganglia. Periventricular white matter and corona radiata hypodensities favor chronic ischemic microvascular white matter disease.  Otherwise, The brainstem, cerebellum, cerebral peduncles, thalamus, basal ganglia, basilar cisterns, and ventricular system appear within normal limits. No intracranial hemorrhage, mass lesion, or acute CVA. Right forehead scalp hematoma noted.  Poor cortical definition of the right lateral wall of the right sphenoid sinus from the middle cranial fossa. Chronic right sphenoid sinusitis. Appearance not significantly changed from 11/07/2014.  Remote left medial orbital wall fracture. There is atherosclerotic calcification of the cavernous carotid arteries bilaterally.  CT CERVICAL SPINE FINDINGS  Multilevel cervical spondylosis and degenerative disc disease with loss of disc height most notable at C5-6 and C6-7, a superior endplate Schmorl's node at T1, and spurring causing  osseous foraminal stenosis on the left at C2-3 and C3-4. Straightening of the cervical lordosis. Aortic and branch vessel atherosclerotic calcification.  No acute cervical spine findings observed.  IMPRESSION: 1. Right forehead scalp hematoma.  No acute intracranial findings. 2. Chronic small lacunar infarcts in both basal ganglia. Periventricular white matter and corona radiata hypodensities favor chronic ischemic microvascular white matter disease. 3. Cervical spondylosis and degenerative disc disease. 4. Atherosclerosis. 5. Remote left medial orbital wall fracture. 6. Chronic right sphenoid sinusitis, with poor cortical definition of the right lateral wall of the sphenoid sinus presumably related to the chronic sinusitis. Given that the sphenoid sinus is not completely opacified, I am doubtful that this represents an acute or urgent situation with the possibility of infection entering the right middle cranial fossa, but if the patient were to exhibit unusual neurologic signs than MRI brain with without contrast might be warranted.   Electronically Signed   By: Van Clines M.D.   On: 12/02/2014 20:27   Ct Cervical Spine Wo Contrast  12/03/2014   CLINICAL DATA:  81 year old male with history of trauma from a fall. Dementia. Combative patient.  EXAM: CT HEAD WITHOUT CONTRAST  CT CERVICAL SPINE WITHOUT CONTRAST  TECHNIQUE: Multidetector CT imaging of the head and cervical spine was performed  following the standard protocol without intravenous contrast. Multiplanar CT image reconstructions of the cervical spine were also generated.  COMPARISON:  Head and cervical spine CT 12/02/2014.  FINDINGS: CT HEAD FINDINGS  Large right frontal scalp hematoma is slightly smaller than yesterday's examination no underlying displaced skull fracture. Moderate cerebral and mild cerebellar atrophy. Patchy and confluent areas of decreased attenuation are noted throughout the deep and periventricular white matter of the cerebral  hemispheres bilaterally, compatible with chronic microvascular ischemic disease. No acute intracranial abnormalities. Specifically, no signs of acute posttraumatic intracranial hemorrhage, no mass, mass effect, hydrocephalus or abnormal intra or extra-axial fluid collections. Mastoids are well pneumatized. Paranasal sinuses are generally well pneumatized, with exception of extensive mucoperiosteal thickening in the right sphenoid sinus, with extensive cortical thinning in the lateral wall of the right sphenoid sinus, similar to the prior examination.  CT CERVICAL SPINE FINDINGS  No acute displaced fracture of the cervical spine. 3 mm of anterolisthesis of C4 on C5 appears to be chronic and is unchanged. Reversal of normal cervical lordosis centered at the level of C5 also unchanged and presumably chronic. Alignment is otherwise anatomic. Prevertebral soft tissues are normal. Severe multilevel degenerative disc disease, most pronounced at C5-C6, C6-C7 and C7-T1. Multilevel facet arthropathy. Mild paraseptal emphysema in the lung apices.  IMPRESSION: 1. Slight decreased size of large right frontal scalp hematoma. No signs of new trauma to the skull, brain or cervical spine. 2. Moderate cerebral and mild cerebellar atrophy with extensive chronic microvascular ischemic changes in the cerebral white matter. 3. Severe multilevel degenerative disc disease and cervical spondylosis redemonstrated, as above. 4. Chronic right sphenoid sinusitis redemonstrated. As noted on the prior examination, there is very poor delineation of the cortex of the lateral wall of the right sphenoid sinus, without definite fluid collection in the right middle cranial fossa to suggest frank intracranial extension of sinus disease.   Electronically Signed   By: Vinnie Langton M.D.   On: 12/03/2014 18:38   Ct Cervical Spine Wo Contrast  12/02/2014   CLINICAL DATA:  Unwitnessed fall. Contusion along the right forehead. Alzheimer dementia. Head  injury.  EXAM: CT HEAD WITHOUT CONTRAST  CT CERVICAL SPINE WITHOUT CONTRAST  TECHNIQUE: Multidetector CT imaging of the head and cervical spine was performed following the standard protocol without intravenous contrast. Multiplanar CT image reconstructions of the cervical spine were also generated.  COMPARISON:  11/07/2014  FINDINGS: CT HEAD FINDINGS  Chronic lacunar infarcts an both basal ganglia. Periventricular white matter and corona radiata hypodensities favor chronic ischemic microvascular white matter disease.  Otherwise, The brainstem, cerebellum, cerebral peduncles, thalamus, basal ganglia, basilar cisterns, and ventricular system appear within normal limits. No intracranial hemorrhage, mass lesion, or acute CVA. Right forehead scalp hematoma noted.  Poor cortical definition of the right lateral wall of the right sphenoid sinus from the middle cranial fossa. Chronic right sphenoid sinusitis. Appearance not significantly changed from 11/07/2014.  Remote left medial orbital wall fracture. There is atherosclerotic calcification of the cavernous carotid arteries bilaterally.  CT CERVICAL SPINE FINDINGS  Multilevel cervical spondylosis and degenerative disc disease with loss of disc height most notable at C5-6 and C6-7, a superior endplate Schmorl's node at T1, and spurring causing osseous foraminal stenosis on the left at C2-3 and C3-4. Straightening of the cervical lordosis. Aortic and branch vessel atherosclerotic calcification.  No acute cervical spine findings observed.  IMPRESSION: 1. Right forehead scalp hematoma.  No acute intracranial findings. 2. Chronic small lacunar infarcts in both basal ganglia.  Periventricular white matter and corona radiata hypodensities favor chronic ischemic microvascular white matter disease. 3. Cervical spondylosis and degenerative disc disease. 4. Atherosclerosis. 5. Remote left medial orbital wall fracture. 6. Chronic right sphenoid sinusitis, with poor cortical definition  of the right lateral wall of the sphenoid sinus presumably related to the chronic sinusitis. Given that the sphenoid sinus is not completely opacified, I am doubtful that this represents an acute or urgent situation with the possibility of infection entering the right middle cranial fossa, but if the patient were to exhibit unusual neurologic signs than MRI brain with without contrast might be warranted.   Electronically Signed   By: Van Clines M.D.   On: 12/02/2014 20:27   Dg Foot Complete Right  12/03/2014   CLINICAL DATA:  Ulcerative the anterior surface of the second toe.  EXAM: RIGHT FOOT COMPLETE - 3+ VIEW  COMPARISON:  None.  FINDINGS: Diffuse bone demineralization. Vascular calcifications. Degenerative changes at the first metatarsal-phalangeal joint and multiple interphalangeal joints. Degenerative changes in the intertarsal joints. Deformity of the calcaneus may be due to degenerative change or old fracture deformity. Degenerative changes in the tibiotalar joint. No focal bone erosion or cortical disruption that would suggest evidence of osteomyelitis.  IMPRESSION: Diffuse bony demineralization and degenerative changes. No acute bony abnormalities. No findings to suggest osteomyelitis.   Electronically Signed   By: Lucienne Capers M.D.   On: 12/03/2014 23:17   Dg Hips Bilat With Pelvis 3-4 Views  12/03/2014   CLINICAL DATA:  Status post fall. Bilateral hip pain. Initial encounter.  EXAM: BILATERAL HIP (WITH PELVIS) 3-4 VIEWS  COMPARISON:  None.  FINDINGS: No acute bony or joint abnormality is identified. No notable degenerative change is seen about the hips. Extensive atherosclerosis is noted.  IMPRESSION: No acute abnormality.   Electronically Signed   By: Inge Rise M.D.   On: 12/03/2014 18:23    Scheduled Meds: . sodium chloride   Intravenous Once  . aspirin EC  81 mg Oral Daily  . Chlorhexidine Gluconate Cloth  6 each Topical Q0600  . feeding supplement (ENSURE COMPLETE)  237  mL Oral BID BM  . furosemide  20 mg Intravenous Once  . lactulose  30 g Oral TID  . latanoprost  1 drop Both Eyes QHS  . mupirocin ointment  1 application Nasal BID  . pantoprazole  40 mg Oral Daily  . rivastigmine  9.5 mg Transdermal Daily  . sodium chloride  3 mL Intravenous Q12H  . sucralfate  1 g Oral BID   Continuous Infusions: . sodium chloride 1,000 mL (12/04/14 0215)    Active Problems:   Falls frequently   Dementia   Anemia   Hepatic encephalopathy   Cirrhosis   Prolonged Q-T interval on ECG   Dehydration    Time spent: 35 minutes    Kelvin Cellar  [facility] Pager [phone]. If 7PM-7AM, please contact night-coverage at [email or web address], password [facility] 12/04/2014, 12:48 PM  LOS: 1 day

## 2014-12-04 NOTE — Progress Notes (Signed)
PT Cancellation Note  Patient Details Name: Nayquan Evinger MRN: [id number] DOB: Apr 18, 1930   Cancelled Treatment:    Reason Eval/Treat Not Completed: Per OT Note, pt confused and combative. Also noted hgb 6.9 and pt set to have transfusion. Will hold PT today and check back another day. Thanks.    Weston Anna, MPT Pager: [phone]

## 2014-12-05 DIAGNOSIS — E440 Moderate protein-calorie malnutrition: Secondary | ICD-10-CM | POA: Diagnosis present

## 2014-12-05 DIAGNOSIS — I447 Left bundle-branch block, unspecified: Secondary | ICD-10-CM | POA: Diagnosis present

## 2014-12-05 LAB — CBC
HEMATOCRIT: 28.8 % — AB (ref 39.0–52.0)
Hemoglobin: 9.1 g/dL — ABNORMAL LOW (ref 13.0–17.0)
MCH: 29.8 pg (ref 26.0–34.0)
MCHC: 31.6 g/dL (ref 30.0–36.0)
MCV: 94.4 fL (ref 78.0–100.0)
Platelets: 121 10*3/uL — ABNORMAL LOW (ref 150–400)
RBC: 3.05 MIL/uL — ABNORMAL LOW (ref 4.22–5.81)
RDW: 17.8 % — AB (ref 11.5–15.5)
WBC: 5 10*3/uL (ref 4.0–10.5)

## 2014-12-05 LAB — HEMOGLOBIN AND HEMATOCRIT, BLOOD
HEMATOCRIT: 30.6 % — AB (ref 39.0–52.0)
HEMOGLOBIN: 9.8 g/dL — AB (ref 13.0–17.0)

## 2014-12-05 LAB — COMPREHENSIVE METABOLIC PANEL
ALT: 20 U/L (ref 0–53)
ANION GAP: 4 — AB (ref 5–15)
AST: 31 U/L (ref 0–37)
Albumin: 2.3 g/dL — ABNORMAL LOW (ref 3.5–5.2)
Alkaline Phosphatase: 127 U/L — ABNORMAL HIGH (ref 39–117)
BILIRUBIN TOTAL: 1.1 mg/dL (ref 0.3–1.2)
BUN: 21 mg/dL (ref 6–23)
CO2: 24 mmol/L (ref 19–32)
Calcium: 8.2 mg/dL — ABNORMAL LOW (ref 8.4–10.5)
Chloride: 118 mmol/L — ABNORMAL HIGH (ref 96–112)
Creatinine, Ser: 1.1 mg/dL (ref 0.50–1.35)
GFR calc Af Amer: 70 mL/min — ABNORMAL LOW (ref >90)
GFR calc non Af Amer: 61 mL/min — ABNORMAL LOW (ref >90)
Glucose, Bld: 82 mg/dL (ref 70–99)
Potassium: 3.3 mmol/L — ABNORMAL LOW (ref 3.5–5.1)
Sodium: 146 mmol/L — ABNORMAL HIGH (ref 135–145)
Total Protein: 5.9 g/dL — ABNORMAL LOW (ref 6.0–8.3)

## 2014-12-05 LAB — TYPE AND SCREEN
ABO/RH(D): O POS
Antibody Screen: NEGATIVE
UNIT DIVISION: 0
Unit division: 0

## 2014-12-05 LAB — AMMONIA: Ammonia: 15 umol/L (ref 11–32)

## 2014-12-05 LAB — TROPONIN I
Troponin I: 0.04 ng/mL — ABNORMAL HIGH (ref <0.031)
Troponin I: 0.04 ng/mL — ABNORMAL HIGH (ref <0.031)

## 2014-12-05 NOTE — Progress Notes (Signed)
[facility] PROGRESS NOTE  Patrick Jefferson LGX:[id number] DOB: 1930/09/15 DOA: 12/03/2014 PCP: Sande Brothers, MD  Assessment/Plan: #1 hepatic encephalopathy Patient with history of cirrhosis presented worsening confusion, recurrent falls initial lab work showed an elevated ammonia level of 71. Patient was given some lactulose. Ammonia levels trending down. Patient with no source of infection. Chest x-ray negative, urinalysis negative, foam's of the foot negative for osteomyelitis. Patient currently afebrile. Continue lactulose. Follow.  #2 acute on chronic anemia Hemoglobin currently stable. Hemoccult-negative. Status post 2 units packed red blood cells. Follow H&H.  #3 advanced dementia Patient noted to have acute agitation. Haldol on hold secondary to prolonged QTC. Repeat EKG with some improvement in QTC however showing new left bundle branch block.  #4 new left bundle branch block Patient with no chest pain. Patient with some agitation. Repeat EKG showed a new left bundle branch block. Will cycle cardiac enzymes. Check a 2-D echo. Follow.  #5 prolonged QTC Repeat EKG with some improvement in QTC. Continue to hold Haldol. Follow.  #6 falls PT/OT.    Code Status: Full Family Communication: No family at bedside. Disposition Plan: Back to facility when medically stable.   Consultants:  None  Procedures:  X-ray of the hips/pelvis 12/03/2014  X-ray of the foot 12/03/2014  X-ray of the L-spine 12/03/2014  Chest x-ray 12/03/2014  2 units packed red blood cells 12/04/2014  Antibiotics:  None  HPI/Subjective: Patient sleeping after receiving some Ativan. Patient was agitated earlier on.  Objective: Filed Vitals:   12/05/14 1434  BP: 142/67  Pulse: 70  Temp: 97.9 F (36.6 C)  Resp: 18    Intake/Output Summary (Last 24 hours) at 12/05/14 2031 Last data filed at 12/05/14 1725  Gross per 24 hour  Intake 3012.75 ml  Output      0 ml  Net 3012.75 ml   Filed  Weights   12/04/14 0012  Weight: 83.7 kg (184 lb 8.4 oz)    Exam:   General:  Sleeping.  Cardiovascular: RRR  Respiratory: CTAB anterior lung fields  Abdomen: Soft, nontender, nondistended, positive bowel sounds.  Musculoskeletal: No clubbing cyanosis or edema.  Data Reviewed: Basic Metabolic Panel:  Recent Labs Lab 12/03/14 1816 12/04/14 0510 12/05/14 1100  NA 146* 147* 146*  K 4.2 3.6 3.3*  CL 115* 116* 118*  CO2 24 25 24   GLUCOSE 108* 91 82  BUN 21 22 21   CREATININE 1.01 0.97 1.10  CALCIUM 8.6 8.3* 8.2*  MG  --  1.9  --   PHOS  --  3.3  --    Liver Function Tests:  Recent Labs Lab 12/03/14 1816 12/04/14 0510 12/05/14 1100  AST 44* 35 31  ALT 25 22 20   ALKPHOS 175* 140* 127*  BILITOT 0.6 0.7 1.1  PROT 6.6 5.8* 5.9*  ALBUMIN 2.6* 2.3* 2.3*   No results for input(s): LIPASE, AMYLASE in the last 168 hours.  Recent Labs Lab 12/03/14 1816 12/04/14 0510 12/05/14 1100  AMMONIA 71* 20 15   CBC:  Recent Labs Lab 12/03/14 1816 12/04/14 0510 12/05/14 0015 12/05/14 1100  WBC 4.7 3.8*  --  5.0  NEUTROABS 3.5  --   --   --   HGB 7.9* 6.9* 9.8* 9.1*  HCT 26.2* 22.1* 30.6* 28.8*  MCV 97.8 97.4  --  94.4  PLT 140* 115*  --  121*   Cardiac Enzymes:  Recent Labs Lab 12/05/14 1610  TROPONINI 0.04*   BNP (last 3 results) No results for input(s): BNP in the  last 8760 hours.  ProBNP (last 3 results) No results for input(s): PROBNP in the last 8760 hours.  CBG: No results for input(s): GLUCAP in the last 168 hours.  Recent Results (from the past 240 hour(s))  MRSA PCR Screening     Status: Abnormal   Collection Time: 12/04/14 12:32 AM  Result Value Ref Range Status   MRSA by PCR POSITIVE (A) NEGATIVE Final    Comment:        The GeneXpert MRSA Assay (FDA approved for NASAL specimens only), is one component of a comprehensive MRSA colonization surveillance program. It is not intended to diagnose MRSA infection nor to guide or monitor  treatment for MRSA infections. RESULT CALLED TO, READ BACK BY AND VERIFIED WITH: D. BROWN RN AT 0430 ON 03.08.16 BY SHUEA      Studies: Dg Foot Complete Right  12-26-2014   CLINICAL DATA:  Ulcerative the anterior surface of the second toe.  EXAM: RIGHT FOOT COMPLETE - 3+ VIEW  COMPARISON:  None.  FINDINGS: Diffuse bone demineralization. Vascular calcifications. Degenerative changes at the first metatarsal-phalangeal joint and multiple interphalangeal joints. Degenerative changes in the intertarsal joints. Deformity of the calcaneus may be due to degenerative change or old fracture deformity. Degenerative changes in the tibiotalar joint. No focal bone erosion or cortical disruption that would suggest evidence of osteomyelitis.  IMPRESSION: Diffuse bony demineralization and degenerative changes. No acute bony abnormalities. No findings to suggest osteomyelitis.   Electronically Signed   By: Lucienne Capers M.D.   On: 12-26-2014 23:17    Scheduled Meds: . sodium chloride   Intravenous Once  . aspirin EC  81 mg Oral Daily  . Chlorhexidine Gluconate Cloth  6 each Topical Q0600  . feeding supplement (ENSURE COMPLETE)  237 mL Oral BID BM  . lactulose  30 g Oral TID  . latanoprost  1 drop Both Eyes QHS  . mupirocin ointment  1 application Nasal BID  . pantoprazole  40 mg Oral Daily  . rivastigmine  9.5 mg Transdermal Daily  . sodium chloride  3 mL Intravenous Q12H  . sucralfate  1 g Oral BID   Continuous Infusions: . sodium chloride 1,000 mL (12/05/14 1235)    Principal Problem:   Hepatic encephalopathy Active Problems:   Falls frequently   Dementia   Anemia   Cirrhosis   Prolonged Q-T interval on ECG   Dehydration   Malnutrition of moderate degree   New left bundle branch block (LBBB)    Time spent: 40 mins    [facility] MD [facility] Pager [phone]. If 7PM-7AM, please contact night-coverage at [email or web address], password [facility] 12/05/2014, 8:31 PM  LOS: 2 days

## 2014-12-05 NOTE — Progress Notes (Signed)
PT Cancellation Note  Patient Details Name: Patrick Jefferson MRN: [id number] DOB: 04/29/1930   Cancelled Treatment:    Reason Eval/Treat Not Completed: Other (comment) (pt continues to remain confused, combative per nursing staff)   York Ram E 12/05/2014, 11:23 AM Carmelia Bake, PT, DPT 12/05/2014 Pager: [phone]

## 2014-12-05 NOTE — Progress Notes (Signed)
Patient's daughter, Edwena Felty, called this rn to check on her dad. Pt's dtr updated. Said she would come by tomorrow late AM to visit pt. Vwilliams,rn.

## 2014-12-05 NOTE — Progress Notes (Signed)
OT Cancellation Note  Patient Details Name: Patrick Jefferson MRN: [id number] DOB: 09-01-1930   Cancelled Treatment:    Reason Eval/Treat Not Completed: Other (comment) (pt combative) Will reattempt later in day or next day Corbin, Thereasa Parkin 12/05/2014, 10:57 AM

## 2014-12-05 NOTE — Care Management Note (Signed)
    Page 1 of 1   12/05/2014     2:18:42 PM CARE MANAGEMENT NOTE 12/05/2014  Patient:  Patrick Jefferson, Patrick Jefferson   Account Number:  [id number]  Date Initiated:  12/04/2014  Documentation initiated by:  Sunday Spillers  Subjective/Objective Assessment:   81 yo male admitted with falls, anemia. PTA lived at [place].     Action/Plan:   Return to ALF when stable   Anticipated DC Date:  12/04/2014   Anticipated DC Plan:  ASSISTED LIVING / REST HOME  In-house referral  Clinical Social Worker      DC Planning Services  CM consult      Choice offered to / List presented to:             Status of service:  In process, will continue to follow Medicare Important Message given?   (If response is "NO", the following Medicare IM given date fields will be blank) Date Medicare IM given:   Medicare IM given by:   Date Additional Medicare IM given:   Additional Medicare IM given by:    Discharge Disposition:    Per UR Regulation:  Reviewed for med. necessity/level of care/duration of stay  If discussed at [place] of Stay Meetings, dates discussed:    Comments:  12/05/14 14:00 CM received Transfer papers from [place].  CM notes pt is confused and unable to discuss transfer options.  CM spoke with RN who confirms daughter, Edwena Felty [phone] is pt's caretaker.  CM called Edwena Felty who states she is happy with care of her father at [facility] and does not wish to exercise option to transfer to [place].  However, Edwena Felty is adamantly opposed to having her father return to [place] and requests CSW to please arrange another place. Edwena Felty agrees as discharge approaches, MD and CSW will have a better idea of level of care her father will need. CM spoke with CSW, [place] and relayed message Edwena Felty does NOT want pt to go back to [facility].  CSW will follow.  NO other CM needs were communicated.  Mariane Masters, BSN, CM [phone].

## 2014-12-06 LAB — COMPREHENSIVE METABOLIC PANEL
ALBUMIN: 2.2 g/dL — AB (ref 3.5–5.2)
ALT: 24 U/L (ref 0–53)
AST: 42 U/L — ABNORMAL HIGH (ref 0–37)
Alkaline Phosphatase: 138 U/L — ABNORMAL HIGH (ref 39–117)
Anion gap: 5 (ref 5–15)
BUN: 24 mg/dL — AB (ref 6–23)
CO2: 27 mmol/L (ref 19–32)
Calcium: 8.4 mg/dL (ref 8.4–10.5)
Chloride: 119 mmol/L — ABNORMAL HIGH (ref 96–112)
Creatinine, Ser: 1.02 mg/dL (ref 0.50–1.35)
GFR calc Af Amer: 77 mL/min — ABNORMAL LOW (ref >90)
GFR calc non Af Amer: 66 mL/min — ABNORMAL LOW (ref >90)
GLUCOSE: 137 mg/dL — AB (ref 70–99)
Potassium: 3.7 mmol/L (ref 3.5–5.1)
Sodium: 151 mmol/L — ABNORMAL HIGH (ref 135–145)
Total Bilirubin: 0.7 mg/dL (ref 0.3–1.2)
Total Protein: 6 g/dL (ref 6.0–8.3)

## 2014-12-06 LAB — CBC
HCT: 29.1 % — ABNORMAL LOW (ref 39.0–52.0)
Hemoglobin: 9.4 g/dL — ABNORMAL LOW (ref 13.0–17.0)
MCH: 30.6 pg (ref 26.0–34.0)
MCHC: 32.3 g/dL (ref 30.0–36.0)
MCV: 94.8 fL (ref 78.0–100.0)
PLATELETS: 111 10*3/uL — AB (ref 150–400)
RBC: 3.07 MIL/uL — ABNORMAL LOW (ref 4.22–5.81)
RDW: 17.3 % — ABNORMAL HIGH (ref 11.5–15.5)
WBC: 5.4 10*3/uL (ref 4.0–10.5)

## 2014-12-06 LAB — MAGNESIUM: Magnesium: 2 mg/dL (ref 1.5–2.5)

## 2014-12-06 LAB — AMMONIA: Ammonia: 46 umol/L — ABNORMAL HIGH (ref 11–32)

## 2014-12-06 MED ORDER — DEXTROSE 5 % IV SOLN
INTRAVENOUS | Status: DC
  Administered 2014-12-06 – 2014-12-09 (×4): via INTRAVENOUS
  Filled 2014-12-06 (×8): qty 1000

## 2014-12-06 NOTE — Progress Notes (Signed)
Speech Language Pathology Treatment: Dysphagia  Patient Details Name: Patrick Jefferson MRN: [id number] DOB: Aug 07, 1930 Today's Date: 12/06/2014 Time: 6203-5597 SLP Time Calculation (min) (ACUTE ONLY): 11 min  Assessment / Plan / Recommendation Clinical Impression  Pt allowed SLP to clean lips but not inside oral cavity. Cooperative with consumption of thin liquid via straw, magic cup and regular texture (for possible upgrade). No indications of penetration or aspiration present. Prolonged mastication and transit with regular texture assisted with bites magic cup ice cream. RN tech reported pt has not eaten/ddrank much until last evening. CXR 3/7 without acute disease, afebrile except for 100 temp yesterday and RN reported lung sounds stable. He is at higher risk due to cognitive deficits and  will likely require a puree texture due to severity of dementia (uncertain of baseline swallow function), continue thin liquids and pills crush in applesauce. SLP to sign off at this time, however if difficulty arises please reconsult if needed.   HPI HPI: 81 year old admitted 12/03/14 due to recurrent falls. PMH significant for dementia, liver cirrhosis. Pt found to have elevated ammonia levels, suggestive of hepatic encephalopathy. BSE ordered tp determine least restrictive diet.   Pertinent Vitals Pain Assessment:  (no indications)  SLP Plan  Discharge SLP treatment due to (comment) (suspect at baseline status (?))    Recommendations Diet recommendations: Dysphagia 1 (puree);Thin liquid Liquids provided via: Cup;Straw Medication Administration: Crushed with puree Supervision: Full supervision/cueing for compensatory strategies;Staff to assist with self feeding Compensations: Slow rate;Small sips/bites;Check for pocketing;Follow solids with liquid Postural Changes and/or Swallow Maneuvers: Seated upright 90 degrees;Upright 30-60 min after meal              Oral Care Recommendations: Oral care BID Follow  up Recommendations: 24 hour supervision/assistance;Skilled Nursing facility Plan: Discharge SLP treatment due to (comment) (suspect at baseline status (?))    GO     Patrick Jefferson 12/06/2014, 9:21 AM   Patrick Jefferson.Ed [employer] [phone]

## 2014-12-06 NOTE — Clinical Social Work Note (Signed)
CSW met with pt's daughter at bedside to discuss SNF options  Pt's daughter is not happy with pt's ALF at [facility] and is open to a SNF  Pt's daughter stated that pt is a veteran and she would like him to go to the veterans SNF in [place] or [place] will follow up with calling [place] center to assess if this is a possibility  .Dede Query, LCSW [facility] Clinical Social Worker - Weekend Coverage cell #: [phone]

## 2014-12-06 NOTE — Evaluation (Signed)
Occupational Therapy Evaluation Patient Details Name: Patrick Jefferson MRN: [id number] DOB: 12/01/1929 Today's Date: 12/06/2014    History of Present Illness This 81 year old man was admitted from ALF with recurrent falls.  He was found to have hepatic encephalopathy.  He has a h/o recurrent falls and cirrhosis of the liver   Clinical Impression   This 81 year old man was admitted for the above.  He was minimally participatory with therapy with daughter's encouragement, but would benefit from OT to increase mobility and decrease secondary complications as well as decrease burden of care.  Will follow in acute on a trial basis focusing on self feeding as well as unsupported sitting as a precursor to toilet transfers. Pt did feed himself and walk with walker prior to admission, although he did have recurrent falls    Follow Up Recommendations  SNF    Equipment Recommendations  None recommended by OT    Recommendations for Other Services       Precautions / Restrictions Precautions Precautions: Fall Restrictions Weight Bearing Restrictions: No      Mobility Bed Mobility Overal bed mobility: +2 for physical assistance;Needs Assistance Bed Mobility: Rolling Rolling: Total assist;+2 for physical assistance;+2 for safety/equipment   Supine to sit: Total assist;+2 for physical assistance;+2 for safety/equipment Sit to supine: Total assist;+2 for physical assistance;+2 for safety/equipment   General bed mobility comments: pt 0% participation  Transfers                      Balance Overall balance assessment: History of Falls;Needs assistance     Sitting balance - Comments: needed mod to max A to sit EOB                                    ADL Overall ADL's : Needs assistance/impaired Eating/Feeding: Moderate assistance;Sitting (with max A to sit EOB)               Upper Body Dressing : Total assistance;Bed level           Toileting- Clothing  Manipulation and Hygiene: Total assistance;+2 for physical assistance;+2 for safety/equipment;Bed level         General ADL Comments: Pt sat EOB when OT/PT assisted to this position. Pt had posterior lean and required assistance to sit.  He ate milky way candy bar that daughter brought and drank some tea, with therapist holding cup.  He would not lift arms when asked and did not lift them to assist with changing gown.  Total A of one for UB adls and 2 for LB adls.       Vision     Perception     Praxis      Pertinent Vitals/Pain Pain Assessment: Faces Pain Score: 0-No pain Faces Pain Scale:  (pt c/o therapist tickling feet when donning socks)     Hand Dominance     Extremity/Trunk Assessment Upper Extremity Assessment Upper Extremity Assessment: Difficult to assess due to impaired cognition (pt refused to lift arms--therapist moved about 20degrees for)           Communication Communication Communication: No difficulties   Cognition Arousal/Alertness: Awake/alert Behavior During Therapy: WFL for tasks assessed/performed Overall Cognitive Status: History of cognitive impairments - at baseline                     General Comments  Exercises       Shoulder Instructions      Home Living Family/patient expects to be discharged to:: Skilled nursing facility                                        Prior Functioning/Environment Level of Independence: Needs assistance        Comments: pt was self feeding:  up until recently he held cup.  Pt would ambulate with RW    OT Diagnosis: Generalized weakness   OT Problem List: Decreased strength;Decreased activity tolerance;Impaired balance (sitting and/or standing);Decreased cognition;Decreased safety awareness   OT Treatment/Interventions: Self-care/ADL training;DME and/or AE instruction;Balance training;Patient/family education;Therapeutic activities    OT Goals(Current goals can be found  in the care plan section) Acute Rehab OT Goals Patient Stated Goal: none stated:  participated with OT/PT with daughter's encouragement.  She would like him to maximize function OT Goal Formulation: With family Time For Goal Achievement: 12/20/14 Potential to Achieve Goals: Fair ADL Goals Pt Will Perform Eating: with supervision;sitting;bed level (food with utensils/cup with cues) Additional ADL Goal #1: pt will sit at EOB with min support for 5 minutes in preparation for transfer to 3;1 commode  OT Frequency: Min 1X/week   Barriers to D/C:            Co-evaluation PT/OT/SLP Co-Evaluation/Treatment: Yes Reason for Co-Treatment: For patient/therapist safety PT goals addressed during session: Mobility/safety with mobility OT goals addressed during session: ADL's and self-care      End of Session    Activity Tolerance: Patient limited by fatigue Patient left: in bed;with call bell/phone within reach;with bed alarm set;with family/visitor present   Time: 2023-3435 OT Time Calculation (min): 27 min Charges:  OT General Charges $OT Visit: 1 Procedure OT Evaluation $Initial OT Evaluation Tier I: 1 Procedure G-Codes:    Trayvond Viets 12/22/2014, 2:34 PM  [name], OTR/L [phone] 22-Dec-2014

## 2014-12-06 NOTE — Evaluation (Signed)
Physical Therapy Evaluation Patient Details Name: Patrick Jefferson MRN: [id number] DOB: 08-11-1930 Today's Date: 12/06/2014   History of Present Illness   81 year old male admitted from ALF with recurrent falls.  He was found to have hepatic encephalopathy.  He has a h/o recurrent falls and cirrhosis of the liver  Clinical Impression  Pt admitted with above diagnosis. Pt currently with functional limitations due to the deficits listed below (see PT Problem List).  Pt will benefit from skilled PT to increase their independence and safety with mobility to allow discharge to the venue listed below.  Pt required encouragement from daughter to participate.  Pt would only sit EOB then assisted back to supine.     Follow Up Recommendations SNF    Equipment Recommendations  None recommended by PT    Recommendations for Other Services       Precautions / Restrictions Precautions Precautions: Fall Restrictions Weight Bearing Restrictions: No      Mobility  Bed Mobility Overal bed mobility: +2 for physical assistance;Needs Assistance Bed Mobility: Rolling;Supine to Sit;Sit to Supine Rolling: Total assist;+2 for physical assistance;+2 for safety/equipment   Supine to sit: Total assist;+2 for physical assistance;+2 for safety/equipment Sit to supine: Total assist;+2 for physical assistance;+2 for safety/equipment   General bed mobility comments: pt more agreeable when daughter mentioned milky way candy bar so less resistant however did not participate  Transfers                    Ambulation/Gait                Stairs            Wheelchair Mobility    Modified Rankin (Stroke Patients Only)       Balance Overall balance assessment: History of Falls;Needs assistance   Sitting balance-Leahy Scale: Zero Sitting balance - Comments: needed max A for trunk to sit EOB Postural control: Posterior lean                                   Pertinent  Vitals/Pain Pain Assessment: Faces Pain Score: 0-No pain Faces Pain Scale: No hurt (agitated at times however no signs of acute pain)    Home Living Family/patient expects to be discharged to:: Skilled nursing facility                      Prior Function Level of Independence: Needs assistance         Comments: pt was self feeding:  up until recently he held cup.  Pt would ambulate with RW     Hand Dominance        Extremity/Trunk Assessment   Upper Extremity Assessment: Difficult to assess due to impaired cognition (pt refused to lift arms--therapist moved about 20degrees for)           Lower Extremity Assessment: Difficult to assess due to impaired cognition;Generalized weakness (daughter reports LE stiffness is pt has been in bed awhile)         Communication   Communication: No difficulties  Cognition Arousal/Alertness: Awake/alert Behavior During Therapy: WFL for tasks assessed/performed Overall Cognitive Status: History of cognitive impairments - at baseline                      General Comments      Exercises        Assessment/Plan  PT Assessment Patient needs continued PT services  PT Diagnosis Difficulty walking   PT Problem List Decreased activity tolerance;Decreased balance;Decreased mobility;Decreased safety awareness  PT Treatment Interventions DME instruction;Gait training;Functional mobility training;Patient/family education;Therapeutic activities;Therapeutic exercise;Wheelchair mobility training;Balance training   PT Goals (Current goals can be found in the Care Plan section) Acute Rehab PT Goals Patient Stated Goal: none stated:  participated with OT/PT with daughter's encouragement.  She would like him to maximize function PT Goal Formulation: With family Time For Goal Achievement: 12/20/14 Potential to Achieve Goals: Fair    Frequency Min 2X/week   Barriers to discharge        Co-evaluation   Reason for  Co-Treatment: Necessary to address cognition/behavior during functional activity;For patient/therapist safety PT goals addressed during session: Mobility/safety with mobility;Balance OT goals addressed during session: ADL's and self-care       End of Session   Activity Tolerance: Treatment limited secondary to agitation Patient left: in bed;with call bell/phone within reach;with bed alarm set;with family/visitor present           Time: 3299-2426 PT Time Calculation (min) (ACUTE ONLY): 27 min   Charges:   PT Evaluation $Initial PT Evaluation Tier I: 1 Procedure     PT G Codes:        Reyanne Hussar,KATHrine E 12/06/2014, 3:45 PM Carmelia Bake, PT, DPT 12/06/2014 Pager: [phone]

## 2014-12-06 NOTE — Progress Notes (Signed)
[facility] PROGRESS NOTE  Patrick Jefferson MBE:[id number] DOB: 1930/08/26 DOA: 12/03/2014 PCP: Sande Brothers, MD  Assessment/Plan: #1 hepatic encephalopathy Patient with history of cirrhosis presented worsening confusion, recurrent falls initial lab work showed an elevated ammonia level of 71. Patient was given some lactulose. Ammonia levels trending down. Patient with no source of infection. Chest x-ray negative, urinalysis negative, foam's of the foot negative for osteomyelitis. Patient currently afebrile. Continue lactulose. Follow.  #2 acute on chronic anemia Hemoglobin currently stable. Hemoccult-negative. Status post 2 units packed red blood cells. Follow H&H.  #3 advanced dementia Patient noted to have acute agitation. Haldol on hold secondary to prolonged QTC. Repeat EKG with some improvement in QTC however showing new left bundle branch block.  #4 new left bundle branch block Patient with no chest pain. Patient with some agitation. Repeat EKG showed a new left bundle branch block. Patient denies any chest pain. Cardiac enzymes minimally elevated. 2-D echo pending. Follow.  #5 prolonged QTC Repeat EKG with some improvement in QTC. Continue to hold Haldol. Repeat EKG. Follow.  #6 falls PT/OT.    Code Status: Full Family Communication: No family at bedside. Disposition Plan: SNF when medically stable.   Consultants:  None  Procedures:  X-ray of the hips/pelvis 12/03/2014  X-ray of the foot 12/03/2014  X-ray of the L-spine 12/03/2014  Chest x-ray 12/03/2014  2 units packed red blood cells 12/04/2014  Antibiotics:  None  HPI/Subjective: Patient states he is ready to die. Patient denies any chest or shortness of breath. Patient alert to self.  Objective: Filed Vitals:   12/06/14 0544  BP: 140/57  Pulse: 71  Temp: 98 F (36.7 C)  Resp: 18    Intake/Output Summary (Last 24 hours) at 12/06/14 1835 Last data filed at 12/06/14 0071  Gross per 24 hour   Intake    120 ml  Output    150 ml  Net    -30 ml   Filed Weights   12/04/14 0012  Weight: 83.7 kg (184 lb 8.4 oz)    Exam:   General:  NAD  Cardiovascular: RRR  Respiratory: CTAB anterior lung fields  Abdomen: Soft, nontender, nondistended, positive bowel sounds.  Musculoskeletal: No clubbing cyanosis or edema.  Data Reviewed: Basic Metabolic Panel:  Recent Labs Lab 12/03/14 1816 12/04/14 0510 12/05/14 1100 12/06/14 0320  NA 146* 147* 146* 151*  K 4.2 3.6 3.3* 3.7  CL 115* 116* 118* 119*  CO2 24 25 24 27   GLUCOSE 108* 91 82 137*  BUN 21 22 21  24*  CREATININE 1.01 0.97 1.10 1.02  CALCIUM 8.6 8.3* 8.2* 8.4  MG  --  1.9  --  2.0  PHOS  --  3.3  --   --    Liver Function Tests:  Recent Labs Lab 12/03/14 1816 12/04/14 0510 12/05/14 1100 12/06/14 0320  AST 44* 35 31 42*  ALT 25 22 20 24   ALKPHOS 175* 140* 127* 138*  BILITOT 0.6 0.7 1.1 0.7  PROT 6.6 5.8* 5.9* 6.0  ALBUMIN 2.6* 2.3* 2.3* 2.2*   No results for input(s): LIPASE, AMYLASE in the last 168 hours.  Recent Labs Lab 12/03/14 1816 12/04/14 0510 12/05/14 1100 12/06/14 0320  AMMONIA 71* 20 15 46*   CBC:  Recent Labs Lab 12/03/14 1816 12/04/14 0510 12/05/14 0015 12/05/14 1100 12/06/14 0320  WBC 4.7 3.8*  --  5.0 5.4  NEUTROABS 3.5  --   --   --   --   HGB 7.9* 6.9* 9.8* 9.1* 9.4*  HCT 26.2* 22.1* 30.6* 28.8* 29.1*  MCV 97.8 97.4  --  94.4 94.8  PLT 140* 115*  --  121* 111*   Cardiac Enzymes:  Recent Labs Lab 12/05/14 1610 12/05/14 2233  TROPONINI 0.04* 0.04*   BNP (last 3 results) No results for input(s): BNP in the last 8760 hours.  ProBNP (last 3 results) No results for input(s): PROBNP in the last 8760 hours.  CBG: No results for input(s): GLUCAP in the last 168 hours.  Recent Results (from the past 240 hour(s))  MRSA PCR Screening     Status: Abnormal   Collection Time: 12/04/14 12:32 AM  Result Value Ref Range Status   MRSA by PCR POSITIVE (A) NEGATIVE Final     Comment:        The GeneXpert MRSA Assay (FDA approved for NASAL specimens only), is one component of a comprehensive MRSA colonization surveillance program. It is not intended to diagnose MRSA infection nor to guide or monitor treatment for MRSA infections. RESULT CALLED TO, READ BACK BY AND VERIFIED WITH: D. BROWN RN AT 0430 ON 03.08.16 BY SHUEA      Studies: No results found.  Scheduled Meds: . aspirin EC  81 mg Oral Daily  . Chlorhexidine Gluconate Cloth  6 each Topical Q0600  . feeding supplement (ENSURE COMPLETE)  237 mL Oral BID BM  . lactulose  30 g Oral TID  . latanoprost  1 drop Both Eyes QHS  . mupirocin ointment  1 application Nasal BID  . pantoprazole  40 mg Oral Daily  . rivastigmine  9.5 mg Transdermal Daily  . sodium chloride  3 mL Intravenous Q12H  . sucralfate  1 g Oral BID   Continuous Infusions: . sodium chloride 1,000 mL (12/06/14 1721)    Principal Problem:   Hepatic encephalopathy Active Problems:   Falls frequently   Dementia   Anemia   Cirrhosis   Prolonged Q-T interval on ECG   Dehydration   Malnutrition of moderate degree   New left bundle branch block (LBBB)    Time spent: 40 mins    [facility] MD [facility] Pager [phone]. If 7PM-7AM, please contact night-coverage at [email or web address], password [facility] 12/06/2014, 6:35 PM  LOS: 3 days

## 2014-12-07 DIAGNOSIS — E870 Hyperosmolality and hypernatremia: Secondary | ICD-10-CM

## 2014-12-07 DIAGNOSIS — K7460 Unspecified cirrhosis of liver: Secondary | ICD-10-CM

## 2014-12-07 DIAGNOSIS — G9340 Encephalopathy, unspecified: Secondary | ICD-10-CM | POA: Insufficient documentation

## 2014-12-07 LAB — CBC
HCT: 29.3 % — ABNORMAL LOW (ref 39.0–52.0)
Hemoglobin: 9.1 g/dL — ABNORMAL LOW (ref 13.0–17.0)
MCH: 29.6 pg (ref 26.0–34.0)
MCHC: 31.1 g/dL (ref 30.0–36.0)
MCV: 95.4 fL (ref 78.0–100.0)
PLATELETS: 112 10*3/uL — AB (ref 150–400)
RBC: 3.07 MIL/uL — ABNORMAL LOW (ref 4.22–5.81)
RDW: 16.5 % — AB (ref 11.5–15.5)
WBC: 5.3 10*3/uL (ref 4.0–10.5)

## 2014-12-07 LAB — MAGNESIUM: MAGNESIUM: 1.8 mg/dL (ref 1.5–2.5)

## 2014-12-07 LAB — BASIC METABOLIC PANEL
Anion gap: 4 — ABNORMAL LOW (ref 5–15)
BUN: 16 mg/dL (ref 6–23)
CHLORIDE: 117 mmol/L — AB (ref 96–112)
CO2: 26 mmol/L (ref 19–32)
Calcium: 8.4 mg/dL (ref 8.4–10.5)
Creatinine, Ser: 0.93 mg/dL (ref 0.50–1.35)
GFR calc Af Amer: 88 mL/min — ABNORMAL LOW (ref >90)
GFR calc non Af Amer: 76 mL/min — ABNORMAL LOW (ref >90)
GLUCOSE: 113 mg/dL — AB (ref 70–99)
POTASSIUM: 3.9 mmol/L (ref 3.5–5.1)
Sodium: 147 mmol/L — ABNORMAL HIGH (ref 135–145)

## 2014-12-07 MED ORDER — LORAZEPAM 0.5 MG PO TABS
0.5000 mg | ORAL_TABLET | Freq: Two times a day (BID) | ORAL | Status: DC
  Administered 2014-12-07 – 2014-12-08 (×2): 0.5 mg via ORAL
  Filled 2014-12-07 (×2): qty 1

## 2014-12-07 NOTE — Progress Notes (Signed)
[facility] PROGRESS NOTE  Patrick Jefferson PXT:[id number] DOB: 1930-08-11 DOA: 12/03/2014 PCP: Sande Brothers, MD  Assessment/Plan: #1 hepatic encephalopathy Patient with history of cirrhosis presented worsening confusion, recurrent falls initial lab work showed an elevated ammonia level of 71. Patient was given some lactulose. Ammonia levels trending down. Patient with no source of infection. Chest x-ray negative, urinalysis negative, xray of the foot negative for osteomyelitis. Patient currently afebrile. Continue lactulose. Follow.  #2 acute on chronic anemia Hemoglobin currently stable. Hemoccult-negative. Status post 2 units packed red blood cells. H&H stable. Follow H&H.  #3 advanced dementia Patient noted to have acute agitation. Haldol on hold secondary to prolonged QTC. Repeat EKG with some improvement in QTC however showing new left bundle branch block.  Will place on Ativan twice a day.  #4 new left bundle branch block Patient with no chest pain. Patient with some agitation. Repeat EKG showed a new left bundle branch block. Patient denies any chest pain. Cardiac enzymes minimally elevated. 2-D echo pending. Follow.  #5 prolonged QTC Repeat EKG with some improvement in QTC. Continue to hold Haldol. Repeat EKG. Follow.  #6 falls PT/OT.   #7 hypernatremia  continue D5W. Follow.   #8 prognosis  patient with a history of advanced dementia with some agitation. Patient also noted to have a history of cirrhosis and some encephalopathy. Patient alert to self only. Patient with occasional outbursts.  Patient did state yesterday that he wanted to die. Patient likely close to baseline. Patient likely needs skilled nursing facility on discharge. Will consult palliative care for goals of care.    Code Status: Full Family Communication: No family at bedside. Disposition Plan: SNF when medically stable.   Consultants:  None  Procedures:  X-ray of the hips/pelvis  12/03/2014  X-ray of the foot 12/03/2014  X-ray of the L-spine 12/03/2014  Chest x-ray 12/03/2014  2 units packed red blood cells 12/04/2014  Antibiotics:  None  HPI/Subjective: Patient states he is ready to die. Patient denies any chest or shortness of breath. Patient alert to self. Patient occasionally screaming out in the room.  Objective: Filed Vitals:   12/07/14 0602  BP: 164/54  Pulse: 61  Temp: 98 F (36.7 C)  Resp: 18    Intake/Output Summary (Last 24 hours) at 12/07/14 1311 Last data filed at 12/07/14 1227  Gross per 24 hour  Intake      0 ml  Output      0 ml  Net      0 ml   Filed Weights   12/04/14 0012  Weight: 83.7 kg (184 lb 8.4 oz)    Exam:   General:  NAD  Cardiovascular: RRR  Respiratory: CTAB anterior lung fields  Abdomen: Soft, nontender, nondistended, positive bowel sounds.  Musculoskeletal: No clubbing cyanosis or edema.  Data Reviewed: Basic Metabolic Panel:  Recent Labs Lab 12/03/14 1816 12/04/14 0510 12/05/14 1100 12/06/14 0320 12/07/14 0923  NA 146* 147* 146* 151* 147*  K 4.2 3.6 3.3* 3.7 3.9  CL 115* 116* 118* 119* 117*  CO2 24 25 24 27 26   GLUCOSE 108* 91 82 137* 113*  BUN 21 22 21  24* 16  CREATININE 1.01 0.97 1.10 1.02 0.93  CALCIUM 8.6 8.3* 8.2* 8.4 8.4  MG  --  1.9  --  2.0 1.8  PHOS  --  3.3  --   --   --    Liver Function Tests:  Recent Labs Lab 12/03/14 1816 12/04/14 0510 12/05/14 1100 12/06/14 0320  AST 44*  35 31 42*  ALT 25 22 20 24   ALKPHOS 175* 140* 127* 138*  BILITOT 0.6 0.7 1.1 0.7  PROT 6.6 5.8* 5.9* 6.0  ALBUMIN 2.6* 2.3* 2.3* 2.2*   No results for input(s): LIPASE, AMYLASE in the last 168 hours.  Recent Labs Lab 12/03/14 1816 12/04/14 0510 12/05/14 1100 12/06/14 0320  AMMONIA 71* 20 15 46*   CBC:  Recent Labs Lab 12/03/14 1816 12/04/14 0510 12/05/14 0015 12/05/14 1100 12/06/14 0320 12/07/14 0923  WBC 4.7 3.8*  --  5.0 5.4 5.3  NEUTROABS 3.5  --   --   --   --   --    HGB 7.9* 6.9* 9.8* 9.1* 9.4* 9.1*  HCT 26.2* 22.1* 30.6* 28.8* 29.1* 29.3*  MCV 97.8 97.4  --  94.4 94.8 95.4  PLT 140* 115*  --  121* 111* 112*   Cardiac Enzymes:  Recent Labs Lab 12/05/14 1610 12/05/14 2233  TROPONINI 0.04* 0.04*   BNP (last 3 results) No results for input(s): BNP in the last 8760 hours.  ProBNP (last 3 results) No results for input(s): PROBNP in the last 8760 hours.  CBG: No results for input(s): GLUCAP in the last 168 hours.  Recent Results (from the past 240 hour(s))  MRSA PCR Screening     Status: Abnormal   Collection Time: 12/04/14 12:32 AM  Result Value Ref Range Status   MRSA by PCR POSITIVE (A) NEGATIVE Final    Comment:        The GeneXpert MRSA Assay (FDA approved for NASAL specimens only), is one component of a comprehensive MRSA colonization surveillance program. It is not intended to diagnose MRSA infection nor to guide or monitor treatment for MRSA infections. RESULT CALLED TO, READ BACK BY AND VERIFIED WITH: D. BROWN RN AT 0430 ON 03.08.16 BY SHUEA      Studies: No results found.  Scheduled Meds: . aspirin EC  81 mg Oral Daily  . Chlorhexidine Gluconate Cloth  6 each Topical Q0600  . feeding supplement (ENSURE COMPLETE)  237 mL Oral BID BM  . lactulose  30 g Oral TID  . latanoprost  1 drop Both Eyes QHS  . LORazepam  0.5 mg Oral BID  . mupirocin ointment  1 application Nasal BID  . pantoprazole  40 mg Oral Daily  . rivastigmine  9.5 mg Transdermal Daily  . sodium chloride  3 mL Intravenous Q12H  . sucralfate  1 g Oral BID   Continuous Infusions: . dextrose 5 % 1,000 mL infusion 75 mL/hr at 12/06/14 1952    Principal Problem:   Hepatic encephalopathy Active Problems:   Falls frequently   Dementia   Anemia   Cirrhosis   Prolonged Q-T interval on ECG   Dehydration   Malnutrition of moderate degree   New left bundle branch block (LBBB)   Hypernatremia    Time spent: 40 mins    [facility] MD [facility] Pager [phone]. If 7PM-7AM, please contact night-coverage at [email or web address], password [facility] 12/07/2014, 1:11 PM  LOS: 4 days

## 2014-12-07 NOTE — Progress Notes (Signed)
CSW spoke with patient's daughter, Patrick Jefferson (h#: [phone] c#: [phone]) re: discharge planning. Patient was admitted from [facility] but CSW reviewed PT evaluation recommending SNF at discharge. CSW provided SNF bed offers & daughter accepted bed at [facility]. Awaiting Palliative Care Consult.   Clinical Social Work Department CLINICAL SOCIAL WORK PLACEMENT NOTE 12/07/2014  Patient:  Patrick Jefferson, Patrick Jefferson  Account Number:  [id number] Admit date:  12/03/2014  Clinical Social Worker:  Renold Genta  Date/time:  12/07/2014 02:34 PM  Clinical Social Work is seeking post-discharge placement for this patient at the following level of care:   SKILLED NURSING   (*CSW will update this form in Epic as items are completed)   12/07/2014  Patient/family provided with [place] Department of Clinical Social Work's list of facilities offering this level of care within the geographic area requested by the patient (or if unable, by the patient's family).  12/07/2014  Patient/family informed of their freedom to choose among providers that offer the needed level of care, that participate in Medicare, Medicaid or managed care program needed by the patient, have an available bed and are willing to accept the patient.  12/07/2014  Patient/family informed of [facility]' ownership interest in [facility], as well as of the fact that they are under no obligation to receive care at this facility.  PASARR submitted to EDS on 12/07/2014 PASARR number received on 12/07/2014  FL2 transmitted to all facilities in geographic area requested by pt/family on  12/07/2014 FL2 transmitted to all facilities within larger geographic area on   Patient informed that his/her managed care company has contracts with or will negotiate with  certain facilities, including the following:     Patient/family informed of bed offers received:  12/07/2014 Patient chooses bed at [place] Physician recommends and patient chooses bed at    Patient to be transferred to [place] on   Patient to be transferred to facility by  Patient and family notified of transfer on  Name of family member notified:    The following physician request were entered in Epic:   Additional Comments:    Raynaldo Opitz, [place] Social Worker cell #: [phone]

## 2014-12-08 DIAGNOSIS — Z66 Do not resuscitate: Secondary | ICD-10-CM

## 2014-12-08 DIAGNOSIS — C61 Malignant neoplasm of prostate: Secondary | ICD-10-CM | POA: Diagnosis present

## 2014-12-08 DIAGNOSIS — Z515 Encounter for palliative care: Secondary | ICD-10-CM

## 2014-12-08 LAB — BASIC METABOLIC PANEL
ANION GAP: 8 (ref 5–15)
BUN: 12 mg/dL (ref 6–23)
CALCIUM: 8.6 mg/dL (ref 8.4–10.5)
CHLORIDE: 112 mmol/L (ref 96–112)
CO2: 25 mmol/L (ref 19–32)
Creatinine, Ser: 0.84 mg/dL (ref 0.50–1.35)
GFR calc Af Amer: >90 mL/min (ref >90)
GFR calc non Af Amer: 79 mL/min — ABNORMAL LOW (ref >90)
Glucose, Bld: 119 mg/dL — ABNORMAL HIGH (ref 70–99)
POTASSIUM: 4.5 mmol/L (ref 3.5–5.1)
Sodium: 145 mmol/L (ref 135–145)

## 2014-12-08 LAB — CBC
HCT: 31.4 % — ABNORMAL LOW (ref 39.0–52.0)
HEMOGLOBIN: 9.9 g/dL — AB (ref 13.0–17.0)
MCH: 29.9 pg (ref 26.0–34.0)
MCHC: 31.5 g/dL (ref 30.0–36.0)
MCV: 94.9 fL (ref 78.0–100.0)
Platelets: 121 10*3/uL — ABNORMAL LOW (ref 150–400)
RBC: 3.31 MIL/uL — ABNORMAL LOW (ref 4.22–5.81)
RDW: 15.5 % (ref 11.5–15.5)
WBC: 5.4 10*3/uL (ref 4.0–10.5)

## 2014-12-08 MED ORDER — HALOPERIDOL LACTATE 5 MG/ML IJ SOLN: 2.0000 mg | Freq: Four times a day (QID) | INTRAMUSCULAR | Status: DC | PRN

## 2014-12-08 MED ORDER — RISPERIDONE 1 MG PO TBDP
1.0000 mg | ORAL_TABLET | Freq: Two times a day (BID) | ORAL | Status: DC
  Administered 2014-12-08 – 2014-12-11 (×7): 1 mg via ORAL
  Filled 2014-12-08 (×8): qty 1

## 2014-12-08 MED ORDER — FENTANYL 12 MCG/HR TD PT72
12.5000 ug | MEDICATED_PATCH | TRANSDERMAL | Status: DC
  Administered 2014-12-08 – 2014-12-11 (×2): 12.5 ug via TRANSDERMAL
  Filled 2014-12-08 (×2): qty 1

## 2014-12-08 MED ORDER — LORAZEPAM 1 MG PO TABS
1.0000 mg | ORAL_TABLET | Freq: Three times a day (TID) | ORAL | Status: DC
  Administered 2014-12-08 – 2014-12-11 (×8): 1 mg via ORAL
  Filled 2014-12-08 (×9): qty 1

## 2014-12-08 MED ORDER — FENTANYL CITRATE 0.05 MG/ML IJ SOLN
12.5000 ug | INTRAMUSCULAR | Status: DC | PRN
  Administered 2014-12-08 (×2): 12.5 ug via INTRAVENOUS
  Filled 2014-12-08 (×2): qty 2

## 2014-12-08 NOTE — Progress Notes (Signed)
  Echocardiogram 2D Echocardiogram has been performed.  Patrick Jefferson 12/08/2014, 9:54 AM

## 2014-12-08 NOTE — Consult Note (Signed)
Palliative Medicine Team at [facility]  Date: 12/08/2014   Patient Name: Patrick Jefferson  DOB: 15-Jul-1930  MRN: [id number]  Age / Sex: 81 y.o., male   PCP: Sande Brothers, MD Referring Physician: Eugenie Filler, MD  Active Problems: Principal Problem:   Hepatic encephalopathy Active Problems:   Falls frequently   Dementia   Anemia   Cirrhosis   Prolonged Q-T interval on ECG   Dehydration   Malnutrition of moderate degree   New left bundle branch block (LBBB)   Hypernatremia   Encephalopathy acute   HPI/Reason for Consultation: Blaney is a 81 y.o. male with advanced dementia, rapid decline over the past 5 months with falls at [facility], worsening of his behavior, agitation, and confusion. He fell and was brought into ED for evaluation, he has a very large hematoma, wound on his right forehead temple. Work up for infection has been negative. He is not eating very well -will only eat candy bars.  Participants in Discussion: HCPOA: YES, relative Laretta Alstrom   Advance Directive: no   Code Status Orders        Start     Ordered   12/08/14 1237  Do not attempt resuscitation (DNR)   Continuous    Question Answer Comment  In the event of cardiac or respiratory ARREST Do not call a "code blue"   In the event of cardiac or respiratory ARREST Do not perform Intubation, CPR, defibrillation or ACLS   In the event of cardiac or respiratory ARREST Use medication by any route, position, wound care, and other measures to relive pain and suffering. May use oxygen, suction and manual treatment of airway obstruction as needed for comfort.      12/08/14 1238      I have reviewed the medical record, interviewed the patient and family, and examined the patient. The following aspects are pertinent.  Past Medical History  Diagnosis Date  . Alzheimer's dementia   . Liver cirrhosis   . Alzheimer's dementia    History   Social History  . Marital Status: Unknown    Spouse  Name: N/A  . Number of Children: N/A  . Years of Education: N/A   Social History Main Topics  . Smoking status: Former Smoker    Quit date: 12/03/2008  . Smokeless tobacco: Former Systems developer    Quit date: 12/03/2008  . Alcohol Use: No  . Drug Use: No  . Sexual Activity: Not on file   Other Topics Concern  . None   Social History Narrative   History reviewed. No pertinent family history. Scheduled Meds: . feeding supplement (ENSURE COMPLETE)  237 mL Oral BID BM  . fentaNYL  12.5 mcg Transdermal Q72H  . lactulose  30 g Oral TID  . latanoprost  1 drop Both Eyes QHS  . LORazepam  1 mg Oral TID  . mupirocin ointment  1 application Nasal BID  . pantoprazole  40 mg Oral Daily  . risperiDONE  1 mg Oral BID  . rivastigmine  9.5 mg Transdermal Daily  . sodium chloride  3 mL Intravenous Q12H   Continuous Infusions: . dextrose 5 % 1,000 mL infusion 75 mL/hr at 12/07/14 2052   PRN Meds:.acetaminophen **OR** acetaminophen, fentaNYL, haloperidol lactate No Known Allergies CBC:    Component Value Date/Time   WBC 5.4 12/08/2014 0656   HGB 9.9* 12/08/2014 0656   HCT 31.4* 12/08/2014 0656   PLT 121* 12/08/2014 0656   MCV 94.9 12/08/2014 0656   NEUTROABS  3.5 12/03/2014 1816   LYMPHSABS 0.6* 12/03/2014 1816   MONOABS 0.5 12/03/2014 1816   EOSABS 0.0 12/03/2014 1816   BASOSABS 0.0 12/03/2014 1816   Comprehensive Metabolic Panel:    Component Value Date/Time   NA 145 12/08/2014 0656   K 4.5 12/08/2014 0656   CL 112 12/08/2014 0656   CO2 25 12/08/2014 0656   BUN 12 12/08/2014 0656   CREATININE 0.84 12/08/2014 0656   GLUCOSE 119* 12/08/2014 0656   CALCIUM 8.6 12/08/2014 0656   AST 42* 12/06/2014 0320   ALT 24 12/06/2014 0320   ALKPHOS 138* 12/06/2014 0320   BILITOT 0.7 12/06/2014 0320   PROT 6.0 12/06/2014 0320   ALBUMIN 2.2* 12/06/2014 0320    Vital Signs: BP 147/76 mmHg  Pulse 71  Temp(Src) 98.5 F (36.9 C) (Axillary)  Resp 16  Ht 5\' 10"  (1.778 m)  Wt 83.7 kg (184 lb  8.4 oz)  BMI 26.48 kg/m2  SpO2 90% Filed Weights   12/04/14 0012  Weight: 83.7 kg (184 lb 8.4 oz)   03/11 0701 - 03/12 0700 In: 4650 [P.O.:120; I.V.:2635] Out: -   Physical Exam:  Very tender to palpation on his forehead and neck. Does not follow commands-he is very rigid and yells out "Let me die", "Help" me He settles with human presence but needs a high level of attention  Summary of Established Goals of Care and Medical Treatment Preferences  Primary Diagnoses  1. Alzheimer's dementia, acute decline  Active Symptoms: 1. Agitation 2. Fall, pain from head injury 3. Prostate Cancer, treated at [facility] hospital- unclear stage 4. Cirrhosis, ESLD  Psycho-social/Spiritual:  Relative HCPOA Laretta Alstrom. Long history of progressive dementia.  Prognosis: If patient is able to eat and sustain oral nutrition and fluids I estimate <6 months and he would still be very hospice appropriate but if he is not eating or requires an increase in medications for agitation then he may have <2 weeks.   Palliative Performance Scale: 30%  Recommendations:  1. Code Status: DNR confirmed with HCPOA 2. Scope of Treatment: 1. Comfort and "Dignity" are the main goals 2. No transfusions or painful interventions 3. No rehospitalization 4. No lab draws 5. Avoid restraints 6. Allow for a regular diet for his comfort 7. OOB as tolerated-especially if he get agitated and is able to be mobilized 8. Short term IV fluids only, no feeding tubes 9. Discontinue Telemetry monitoring  3. Symptom Management:  1. Suspect pain is underlying hjis delirium and yelling out- he is very tender on his scalp and his joints are also painful on movement 1. Will start a 12.5 duragesic patch- cover with IV fentanyl boluses for uncontrolled pain 2. Noted prologed QT but in order to keep him comfortable we may need to use antipsychotics-will start low dose BID risperidol, will increase his Ativan to TID 3. Avoid  constipation- will schedule senna -s 4. Delirium measures for comfort 4. Palliative Prophylaxis: Senna-s 5. Disposition:  He will need hospice involved- he may be appropriate for referral to Hospice Facility-will see what his PO intake is over the next 24 hours and if his behavior stabilizes.   Time In: 11AM Time Out: 12:10PM Time Total: 70 min Greater than 50%  of this time was spent counseling and coordinating care related to the above assessment and plan.  Signed by: Roma Schanz, DO  12/08/2014, 1:10 PM  Please contact Palliative Medicine Team phone at [phone] for questions and concerns.

## 2014-12-08 NOTE — Progress Notes (Signed)
[facility] PROGRESS NOTE  Patrick Jefferson YCX:[id number] DOB: 1930/03/07 DOA: 12/03/2014 PCP: Sande Brothers, MD  Assessment/Plan: #1 hepatic encephalopathy Patient with history of cirrhosis presented worsening confusion, recurrent falls initial lab work showed an elevated ammonia level of 71. Patient was given some lactulose. Ammonia levels trending down. Patient with no source of infection. Chest x-ray negative, urinalysis negative, xray of the foot negative for osteomyelitis. Patient currently afebrile. Continue lactulose. Follow.  #2 acute on chronic anemia Hemoglobin currently stable. Hemoccult-negative. Status post 2 units packed red blood cells. H&H stable. Follow H&H.  #3 advanced dementia Patient noted to have acute agitation. Haldol on hold secondary to prolonged QTC. Repeat EKG with some improvement in QTC however showing new left bundle branch block.  Continue Ativan twice a day. Patient has been started on risperidal by palliative care. Patient has been seen by palliative care and full comfort approach.   #4 new left bundle branch block Patient with no chest pain. Patient with some agitation. Repeat EKG showed a new left bundle branch block. Patient denies any chest pain. Cardiac enzymes minimally elevated. 2-D echo pending. Follow.  #5 prolonged QTC Repeat EKG with some improvement in QTC. Continue to hold Haldol. Follow.  #6 falls PT/OT.   #7 hypernatremia  continue D5W. Follow.   #8 prognosis  patient with a history of advanced dementia with some agitation. Patient also noted to have a history of cirrhosis and some encephalopathy. Patient alert to self only. Patient with occasional outbursts.  Patient did state yesterday that he wanted to die. Patient likely close to baseline. Patient likely needs skilled nursing facility on discharge. Patient has been seen by palliative care and goal of care is comfort approach.     Code Status: Full Family Communication: No family  at bedside. Disposition Plan: SNF with hospice versus hospice home.    Consultants:  Palliative care: Dr. Hilma Favors 12/08/2014  Procedures:  X-ray of the hips/pelvis 12/03/2014  X-ray of the foot 12/03/2014  X-ray of the L-spine 12/03/2014  Chest x-ray 12/03/2014  2 units packed red blood cells 12/04/2014  2-D echo 12/08/2014  Antibiotics:  None  HPI/Subjective: Patient states he is ready to die. Patient denies any chest or shortness of breath. Patient alert to self. Patient occasionally screaming out in the room.  Objective: Filed Vitals:   12/08/14 1300  BP: 163/63  Pulse: 71  Temp: 98.2 F (36.8 C)  Resp: 18    Intake/Output Summary (Last 24 hours) at 12/08/14 1358 Last data filed at 12/08/14 1300  Gross per 24 hour  Intake   1620 ml  Output    100 ml  Net   1520 ml   Filed Weights   12/04/14 0012  Weight: 83.7 kg (184 lb 8.4 oz)    Exam:   General:  NAD  Cardiovascular: RRR  Respiratory: CTAB anterior lung fields  Abdomen: Soft, nontender, nondistended, positive bowel sounds.  Musculoskeletal: No clubbing cyanosis or edema.  Data Reviewed: Basic Metabolic Panel:  Recent Labs Lab 12/04/14 0510 12/05/14 1100 12/06/14 0320 12/07/14 0923 12/08/14 0656  NA 147* 146* 151* 147* 145  K 3.6 3.3* 3.7 3.9 4.5  CL 116* 118* 119* 117* 112  CO2 25 24 27 26 25   GLUCOSE 91 82 137* 113* 119*  BUN 22 21 24* 16 12  CREATININE 0.97 1.10 1.02 0.93 0.84  CALCIUM 8.3* 8.2* 8.4 8.4 8.6  MG 1.9  --  2.0 1.8  --   PHOS 3.3  --   --   --   --  Liver Function Tests:  Recent Labs Lab 12/03/14 1816 12/04/14 0510 12/05/14 1100 12/06/14 0320  AST 44* 35 31 42*  ALT 25 22 20 24   ALKPHOS 175* 140* 127* 138*  BILITOT 0.6 0.7 1.1 0.7  PROT 6.6 5.8* 5.9* 6.0  ALBUMIN 2.6* 2.3* 2.3* 2.2*   No results for input(s): LIPASE, AMYLASE in the last 168 hours.  Recent Labs Lab 12/03/14 1816 12/04/14 0510 12/05/14 1100 12/06/14 0320  AMMONIA 71* 20 15  46*   CBC:  Recent Labs Lab 12/03/14 1816 12/04/14 0510 12/05/14 0015 12/05/14 1100 12/06/14 0320 12/07/14 0923 12/08/14 0656  WBC 4.7 3.8*  --  5.0 5.4 5.3 5.4  NEUTROABS 3.5  --   --   --   --   --   --   HGB 7.9* 6.9* 9.8* 9.1* 9.4* 9.1* 9.9*  HCT 26.2* 22.1* 30.6* 28.8* 29.1* 29.3* 31.4*  MCV 97.8 97.4  --  94.4 94.8 95.4 94.9  PLT 140* 115*  --  121* 111* 112* 121*   Cardiac Enzymes:  Recent Labs Lab 12/05/14 1610 12/05/14 2233  TROPONINI 0.04* 0.04*   BNP (last 3 results) No results for input(s): BNP in the last 8760 hours.  ProBNP (last 3 results) No results for input(s): PROBNP in the last 8760 hours.  CBG: No results for input(s): GLUCAP in the last 168 hours.  Recent Results (from the past 240 hour(s))  MRSA PCR Screening     Status: Abnormal   Collection Time: 12/04/14 12:32 AM  Result Value Ref Range Status   MRSA by PCR POSITIVE (A) NEGATIVE Final    Comment:        The GeneXpert MRSA Assay (FDA approved for NASAL specimens only), is one component of a comprehensive MRSA colonization surveillance program. It is not intended to diagnose MRSA infection nor to guide or monitor treatment for MRSA infections. RESULT CALLED TO, READ BACK BY AND VERIFIED WITH: D. BROWN RN AT 0430 ON 03.08.16 BY SHUEA      Studies: No results found.  Scheduled Meds: . feeding supplement (ENSURE COMPLETE)  237 mL Oral BID BM  . fentaNYL  12.5 mcg Transdermal Q72H  . lactulose  30 g Oral TID  . latanoprost  1 drop Both Eyes QHS  . LORazepam  1 mg Oral TID  . mupirocin ointment  1 application Nasal BID  . pantoprazole  40 mg Oral Daily  . risperiDONE  1 mg Oral BID  . rivastigmine  9.5 mg Transdermal Daily  . sodium chloride  3 mL Intravenous Q12H   Continuous Infusions: . dextrose 5 % 1,000 mL infusion 75 mL/hr at 12/07/14 2052    Principal Problem:   Hepatic encephalopathy Active Problems:   Falls frequently   Dementia   Anemia   Cirrhosis    Prolonged Q-T interval on ECG   Dehydration   Malnutrition of moderate degree   New left bundle branch block (LBBB)   Hypernatremia   Encephalopathy acute   Prostate cancer   DNR (do not resuscitate)    Time spent: 32 mins    [facility] MD [facility] Pager [phone]. If 7PM-7AM, please contact night-coverage at [email or web address], password [facility] 12/08/2014, 1:58 PM  LOS: 5 days

## 2014-12-09 NOTE — Progress Notes (Signed)
PT Cancellation Note  Patient Details Name: Real Cona MRN: [id number] DOB: [birth year]   Cancelled Treatment:    Reason Eval/Treat Not Completed: Other (comment) (PT discontinue order acknowledged.)   Tamieka Rancourt,KATHrine E 12/09/2014, 8:13 AM Carmelia Bake, PT, DPT 12/09/2014 Pager: [phone]

## 2014-12-09 NOTE — Progress Notes (Addendum)
[facility] PROGRESS NOTE  Patrick Jefferson HFS:[id number] DOB: 23-Nov-1929 DOA: 12/03/2014 PCP: Patrick Brothers, MD  Assessment/Plan: #1 hepatic encephalopathy Patient with history of cirrhosis presented worsening confusion, recurrent falls initial lab work showed an elevated ammonia level of 71. Patient was given some lactulose. Ammonia levels trending down. Patient with no source of infection. Chest x-ray negative, urinalysis negative, xray of the foot negative for osteomyelitis. Patient currently afebrile. Continue lactulose. Follow.  #2 acute on chronic anemia Hemoglobin currently stable. Hemoccult-negative. Status post 2 units packed red blood cells. H&H stable. Follow H&H.  #3 advanced dementia with behavioral disturbance Patient noted to have acute agitation with occasional outburts. Haldol on hold secondary to prolonged QTC. Repeat EKG with some improvement in QTC however showing new left bundle branch block.  Ativan dose has been increased to 3 times daily and patient has been started on twice daily Risperdal per palliative care. Patient has also been placed on a Duragesic patch for pain. Patient has been seen by palliative care and full comfort approach.   #4 new left bundle branch block Patient with no chest pain. Patient with some agitation. Repeat EKG showed a new left bundle branch block. Patient denies any chest pain. Cardiac enzymes minimally elevated. 2 d echo with EF of 50-55%, mild LVH, moderate aortic valvular stenosis. Patient now full comfort. Follow.  #5 prolonged QTC Repeat EKG with some improvement in QTC. Continue to hold Haldol. Follow.  #6 falls PT/OT.   #7 hypernatremia  continue D5W. Follow.   #8 prognosis  patient with a history of advanced dementia with some agitation. Patient also noted to have a history of cirrhosis and some encephalopathy. Patient alert to self only. Patient with occasional outbursts.  Patient did state yesterday that he wanted to die.  Patient likely close to baseline. Patient likely needs skilled nursing facility on discharge. Patient has been seen by palliative care and goal of care is comfort approach.     Code Status: Full Family Communication: No family at bedside. Disposition Plan: SNF with hospice versus hospice home.    Consultants:  Palliative care: Dr. Hilma Jefferson 12/08/2014  Procedures:  X-ray of the hips/pelvis 12/03/2014  X-ray of the foot 12/03/2014  X-ray of the L-spine 12/03/2014  Chest x-ray 12/03/2014  2 units packed red blood cells 12/04/2014  2-D echo 12/06/2014  Antibiotics:  None  HPI/Subjective: Patient alert to self. Patient occasionally screaming out in the room.  Objective: Filed Vitals:   12/09/14 0611  BP: 148/61  Pulse: 69  Temp: 98.1 F (36.7 C)  Resp: 16    Intake/Output Summary (Last 24 hours) at 12/09/14 1147 Last data filed at 12/09/14 0600  Gross per 24 hour  Intake   1725 ml  Output    100 ml  Net   1625 ml   Filed Weights   12/04/14 0012  Weight: 83.7 kg (184 lb 8.4 oz)    Exam:   General:  NAD  Cardiovascular: RRR  Respiratory: CTAB anterior lung fields  Abdomen: Soft, nontender, nondistended, positive bowel sounds.  Musculoskeletal: No clubbing cyanosis or edema.  Data Reviewed: Basic Metabolic Panel:  Recent Labs Lab 12/04/14 0510 12/05/14 1100 12/06/14 0320 12/07/14 0923 12/08/14 0656  NA 147* 146* 151* 147* 145  K 3.6 3.3* 3.7 3.9 4.5  CL 116* 118* 119* 117* 112  CO2 25 24 27 26 25   GLUCOSE 91 82 137* 113* 119*  BUN 22 21 24* 16 12  CREATININE 0.97 1.10 1.02 0.93 0.84  CALCIUM 8.3*  8.2* 8.4 8.4 8.6  MG 1.9  --  2.0 1.8  --   PHOS 3.3  --   --   --   --    Liver Function Tests:  Recent Labs Lab 12/03/14 1816 12/04/14 0510 12/05/14 1100 12/06/14 0320  AST 44* 35 31 42*  ALT 25 22 20 24   ALKPHOS 175* 140* 127* 138*  BILITOT 0.6 0.7 1.1 0.7  PROT 6.6 5.8* 5.9* 6.0  ALBUMIN 2.6* 2.3* 2.3* 2.2*   No results for  input(s): LIPASE, AMYLASE in the last 168 hours.  Recent Labs Lab 12/03/14 1816 12/04/14 0510 12/05/14 1100 12/06/14 0320  AMMONIA 71* 20 15 46*   CBC:  Recent Labs Lab 12/03/14 1816 12/04/14 0510 12/05/14 0015 12/05/14 1100 12/06/14 0320 12/07/14 0923 12/08/14 0656  WBC 4.7 3.8*  --  5.0 5.4 5.3 5.4  NEUTROABS 3.5  --   --   --   --   --   --   HGB 7.9* 6.9* 9.8* 9.1* 9.4* 9.1* 9.9*  HCT 26.2* 22.1* 30.6* 28.8* 29.1* 29.3* 31.4*  MCV 97.8 97.4  --  94.4 94.8 95.4 94.9  PLT 140* 115*  --  121* 111* 112* 121*   Cardiac Enzymes:  Recent Labs Lab 12/05/14 1610 12/05/14 2233  TROPONINI 0.04* 0.04*   BNP (last 3 results) No results for input(s): BNP in the last 8760 hours.  ProBNP (last 3 results) No results for input(s): PROBNP in the last 8760 hours.  CBG: No results for input(s): GLUCAP in the last 168 hours.  Recent Results (from the past 240 hour(s))  MRSA PCR Screening     Status: Abnormal   Collection Time: 12/04/14 12:32 AM  Result Value Ref Range Status   MRSA by PCR POSITIVE (A) NEGATIVE Final    Comment:        The GeneXpert MRSA Assay (FDA approved for NASAL specimens only), is one component of a comprehensive MRSA colonization surveillance program. It is not intended to diagnose MRSA infection nor to guide or monitor treatment for MRSA infections. RESULT CALLED TO, READ BACK BY AND VERIFIED WITH: D. BROWN RN AT 0430 ON 03.08.16 BY SHUEA      Studies: No results found.  Scheduled Meds: . feeding supplement (ENSURE COMPLETE)  237 mL Oral BID BM  . fentaNYL  12.5 mcg Transdermal Q72H  . lactulose  30 g Oral TID  . latanoprost  1 drop Both Eyes QHS  . LORazepam  1 mg Oral TID  . pantoprazole  40 mg Oral Daily  . risperiDONE  1 mg Oral BID  . rivastigmine  9.5 mg Transdermal Daily  . sodium chloride  3 mL Intravenous Q12H   Continuous Infusions: . dextrose 5 % 1,000 mL infusion 75 mL/hr at 12/07/14 2052    Principal Problem:    Hepatic encephalopathy Active Problems:   Falls frequently   Dementia   Anemia   Cirrhosis   Prolonged Q-T interval on ECG   Dehydration   Malnutrition of moderate degree   New left bundle branch block (LBBB)   Hypernatremia   Encephalopathy acute   Prostate cancer   DNR (do not resuscitate)    Time spent: 1 mins    [facility] MD [facility] Pager [phone]. If 7PM-7AM, please contact night-coverage at [email or web address], password [email or web address] 12/09/2014, 11:47 AM  LOS: 6 days

## 2014-12-09 NOTE — Progress Notes (Addendum)
OT Cancellation Note/Discontinuation  Patient Details Name: Patrick Jefferson MRN: [id number] DOB: 08/03/1930   Cancelled Treatment:    Reason Eval/Treat Not Completed: Other (comment)   Received discontinue order:  Pt is on full comfort care.   [name] 12/09/2014, 8:46 AM  [name], OTR/L [phone] 12/09/2014

## 2014-12-10 MED ORDER — DEXTROSE 5 % IV SOLN
INTRAVENOUS | Status: DC
  Administered 2014-12-10 – 2014-12-11 (×2): via INTRAVENOUS

## 2014-12-10 NOTE — Progress Notes (Signed)
Nutrition Brief Note  Chart reviewed. Pt now transitioning to comfort care.  No further nutrition interventions warranted at this time.  Please consult as needed.   Ples Trudel, MS, RD, LDN Pager: [phone] After Hours Pager: [phone]    

## 2014-12-10 NOTE — Progress Notes (Signed)
[facility] PROGRESS NOTE  Patrick Jefferson OFB:[id number] DOB: 02-10-1930 DOA: 12/03/2014 PCP: Sande Brothers, MD  Assessment/Plan: #1 hepatic encephalopathy Patient with history of cirrhosis presented worsening confusion, recurrent falls initial lab work showed an elevated ammonia level of 71. Patient was given some lactulose. Ammonia levels trending down. Patient with no source of infection. Chest x-ray negative, urinalysis negative, xray of the foot negative for osteomyelitis. Patient currently afebrile. Continue lactulose. Follow.  #2 acute on chronic anemia Hemoglobin currently stable. Hemoccult-negative. Status post 2 units packed red blood cells. H&H stable. Follow H&H.  #3 advanced dementia with behavioral disturbance Patient noted to have acute agitation with occasional outburts. Haldol on hold secondary to prolonged QTC. Repeat EKG with some improvement in QTC however showing new left bundle branch block.  Ativan dose has been increased to 3 times daily and patient has been started on twice daily Risperdal per palliative care. Patient has also been placed on a Duragesic patch for pain. Patient has been seen by palliative care and full comfort approach.   #4 new left bundle branch block Patient with no chest pain. Patient with some agitation. Repeat EKG showed a new left bundle branch block. Patient denies any chest pain. Cardiac enzymes minimally elevated. 2 d echo with EF of 50-55%, mild LVH, moderate aortic valvular stenosis. Patient now full comfort. Follow.  #5 prolonged QTC Repeat EKG with some improvement in QTC. Continue to hold Haldol. Follow.  #6 falls PT/OT.   #7 hypernatremia  Improved. Follow.   #8 prognosis  patient with a history of advanced dementia with some agitation. Patient also noted to have a history of cirrhosis and some encephalopathy. Patient alert to self only. Patient with occasional outbursts. Patient has been seen by palliative care and goal of care is  comfort approach.     Code Status: Full Family Communication: No family at bedside. Disposition Plan: SNF with hospice versus hospice home.    Consultants:  Palliative care: Dr. Hilma Favors 12/08/2014  Procedures:  X-ray of the hips/pelvis 12/03/2014  X-ray of the foot 12/03/2014  X-ray of the L-spine 12/03/2014  Chest x-ray 12/03/2014  2 units packed red blood cells 12/04/2014  2-D echo 12/06/2014  Antibiotics:  None  HPI/Subjective: Patient alert to self. Patient occasionally screaming out in the room.  Objective: Filed Vitals:   12/10/14 1338  BP: 155/65  Pulse: 84  Temp: 97.8 F (36.6 C)  Resp: 20    Intake/Output Summary (Last 24 hours) at 12/10/14 1343 Last data filed at 12/10/14 1339  Gross per 24 hour  Intake   2475 ml  Output    425 ml  Net   2050 ml   Filed Weights   12/04/14 0012  Weight: 83.7 kg (184 lb 8.4 oz)    Exam:   General:  NAD  Cardiovascular: RRR  Respiratory: CTAB anterior lung fields  Abdomen: Soft, nontender, nondistended, positive bowel sounds.  Musculoskeletal: No clubbing cyanosis or edema.  Data Reviewed: Basic Metabolic Panel:  Recent Labs Lab 12/04/14 0510 12/05/14 1100 12/06/14 0320 12/07/14 0923 12/08/14 0656  NA 147* 146* 151* 147* 145  K 3.6 3.3* 3.7 3.9 4.5  CL 116* 118* 119* 117* 112  CO2 25 24 27 26 25   GLUCOSE 91 82 137* 113* 119*  BUN 22 21 24* 16 12  CREATININE 0.97 1.10 1.02 0.93 0.84  CALCIUM 8.3* 8.2* 8.4 8.4 8.6  MG 1.9  --  2.0 1.8  --   PHOS 3.3  --   --   --   --  Liver Function Tests:  Recent Labs Lab 12/03/14 1816 12/04/14 0510 12/05/14 1100 12/06/14 0320  AST 44* 35 31 42*  ALT 25 22 20 24   ALKPHOS 175* 140* 127* 138*  BILITOT 0.6 0.7 1.1 0.7  PROT 6.6 5.8* 5.9* 6.0  ALBUMIN 2.6* 2.3* 2.3* 2.2*   No results for input(s): LIPASE, AMYLASE in the last 168 hours.  Recent Labs Lab 12/03/14 1816 12/04/14 0510 12/05/14 1100 12/06/14 0320  AMMONIA 71* 20 15 46*    CBC:  Recent Labs Lab 12/03/14 1816 12/04/14 0510 12/05/14 0015 12/05/14 1100 12/06/14 0320 12/07/14 0923 12/08/14 0656  WBC 4.7 3.8*  --  5.0 5.4 5.3 5.4  NEUTROABS 3.5  --   --   --   --   --   --   HGB 7.9* 6.9* 9.8* 9.1* 9.4* 9.1* 9.9*  HCT 26.2* 22.1* 30.6* 28.8* 29.1* 29.3* 31.4*  MCV 97.8 97.4  --  94.4 94.8 95.4 94.9  PLT 140* 115*  --  121* 111* 112* 121*   Cardiac Enzymes:  Recent Labs Lab 12/05/14 1610 12/05/14 2233  TROPONINI 0.04* 0.04*   BNP (last 3 results) No results for input(s): BNP in the last 8760 hours.  ProBNP (last 3 results) No results for input(s): PROBNP in the last 8760 hours.  CBG: No results for input(s): GLUCAP in the last 168 hours.  Recent Results (from the past 240 hour(s))  MRSA PCR Screening     Status: Abnormal   Collection Time: 12/04/14 12:32 AM  Result Value Ref Range Status   MRSA by PCR POSITIVE (A) NEGATIVE Final    Comment:        The GeneXpert MRSA Assay (FDA approved for NASAL specimens only), is one component of a comprehensive MRSA colonization surveillance program. It is not intended to diagnose MRSA infection nor to guide or monitor treatment for MRSA infections. RESULT CALLED TO, READ BACK BY AND VERIFIED WITH: D. BROWN RN AT 0430 ON 03.08.16 BY SHUEA      Studies: No results found.  Scheduled Meds: . feeding supplement (ENSURE COMPLETE)  237 mL Oral BID BM  . fentaNYL  12.5 mcg Transdermal Q72H  . lactulose  30 g Oral TID  . latanoprost  1 drop Both Eyes QHS  . LORazepam  1 mg Oral TID  . pantoprazole  40 mg Oral Daily  . risperiDONE  1 mg Oral BID  . rivastigmine  9.5 mg Transdermal Daily  . sodium chloride  3 mL Intravenous Q12H   Continuous Infusions: . dextrose 5 % 1,000 mL infusion 75 mL/hr at 12/09/14 2324    Principal Problem:   Hepatic encephalopathy Active Problems:   Falls frequently   Dementia   Anemia   Cirrhosis   Prolonged Q-T interval on ECG   Dehydration    Malnutrition of moderate degree   New left bundle branch block (LBBB)   Hypernatremia   Encephalopathy acute   Prostate cancer   DNR (do not resuscitate)    Time spent: 72 mins    [facility] MD [facility] Pager [phone]. If 7PM-7AM, please contact night-coverage at [email or web address], password [facility] 12/10/2014, 1:43 PM  LOS: 7 days

## 2014-12-10 NOTE — Progress Notes (Signed)
Patient have been sleeping most of the day, poor PO intake. No signs of pain/distress noted. Will continue to F/U with plan of care.

## 2014-12-11 DIAGNOSIS — Z66 Do not resuscitate: Secondary | ICD-10-CM

## 2014-12-11 DIAGNOSIS — Z515 Encounter for palliative care: Secondary | ICD-10-CM | POA: Insufficient documentation

## 2014-12-11 DIAGNOSIS — E440 Moderate protein-calorie malnutrition: Secondary | ICD-10-CM

## 2014-12-11 MED ORDER — LORAZEPAM 1 MG PO TABS: 1.0000 mg | ORAL_TABLET | Freq: Three times a day (TID) | ORAL | Status: AC

## 2014-12-11 MED ORDER — ENSURE COMPLETE PO LIQD: 237.0000 mL | Freq: Two times a day (BID) | ORAL | Status: AC

## 2014-12-11 MED ORDER — FENTANYL 12 MCG/HR TD PT72: 12.5000 ug | MEDICATED_PATCH | TRANSDERMAL | Status: DC

## 2014-12-11 MED ORDER — RISPERIDONE 1 MG PO TBDP: 1.0000 mg | ORAL_TABLET | Freq: Two times a day (BID) | ORAL | Status: AC

## 2014-12-11 MED ORDER — LACTULOSE 10 GM/15ML PO SOLN: 30.0000 g | Freq: Three times a day (TID) | ORAL | Status: DC

## 2014-12-11 NOTE — Progress Notes (Signed)
Patient is not eligible for [facility] per [facility] at that time. CSW spoke with patient's daughter, Patrick Jefferson at bedside & is agreeable with patient going to [facility] with palliative services following.   Patient is set to discharge to [facility] today. Patient & daughter, Patrick Jefferson at bedside aware. Discharge packet given to RN, Patrick Jefferson. [facility] called for transport.   Clinical Social Work Department CLINICAL SOCIAL WORK PLACEMENT NOTE 12/11/2014  Patient:  Patrick Jefferson, Patrick Jefferson  Account Number:  [id number] Admit date:  12/03/2014  Clinical Social Worker:  Renold Genta  Date/time:  12/07/2014 02:34 PM  Clinical Social Work is seeking post-discharge placement for this patient at the following level of care:   SKILLED NURSING   (*CSW will update this form in Epic as items are completed)   12/07/2014  Patient/family provided with [place] Department of Clinical Social Work's list of facilities offering this level of care within the geographic area requested by the patient (or if unable, by the patient's family).  12/07/2014  Patient/family informed of their freedom to choose among providers that offer the needed level of care, that participate in Medicare, Medicaid or managed care program needed by the patient, have an available bed and are willing to accept the patient.  12/07/2014  Patient/family informed of [facility]' ownership interest in [facility], as well as of the fact that they are under no obligation to receive care at this facility.  PASARR submitted to EDS on 12/07/2014 PASARR number received on 12/07/2014  FL2 transmitted to all facilities in geographic area requested by pt/family on  12/07/2014 FL2 transmitted to all facilities within larger geographic area on   Patient informed that his/her managed care company has contracts with or will negotiate with  certain facilities, including the following:     Patient/family informed of bed offers received:   12/07/2014 Patient chooses bed at [facility] Physician recommends and patient chooses bed at    Patient to be transferred to [facility] on  12/11/2014 Patient to be transferred to facility by [facility] Patient and family notified of transfer on 12/11/2014 Name of family member notified:  patient's daughter, Patrick Jefferson at bedside  The following physician request were entered in Epic:   Additional Comments:    Patrick Jefferson, [place] Social Worker cell #: [phone]

## 2014-12-11 NOTE — Discharge Summary (Signed)
Physician Discharge Summary  Patrick Jefferson GUY:[id number] DOB: 02-16-1930 DOA: 12/03/2014  PCP: Sande Brothers, MD  Admit date: 12/03/2014 Discharge date: 12/11/2014  Time spent: 65 minutes  Recommendations for Outpatient Follow-up:  1. Patient be discharged to [facility] skilled nursing facility with hospice following. Please make referral to hospice once patient arrives.  Discharge Diagnoses:  Principal Problem:   Hepatic encephalopathy Active Problems:   Falls frequently   Dementia   Anemia   Cirrhosis   Prolonged Q-T interval on ECG   Dehydration   Malnutrition of moderate degree   New left bundle branch block (LBBB)   Hypernatremia   Encephalopathy acute   Prostate cancer   DNR (do not resuscitate)   Discharge Condition: stable  Diet recommendation: regular  Filed Weights   12/04/14 0012  Weight: 83.7 kg (184 lb 8.4 oz)    History of present illness:  Per Dr Toy Baker   Patrick Jefferson is a 81 y.o. male   has a past medical history of Alzheimer's dementia; Liver cirrhosis; and Alzheimer's dementia.   Presented with  Patient currently resides at [facility] he has history of dementia and liver cirrhosis of unclear etiology. Patient has had recurrent falls in the past 3 days he has fallen at least 3 times. Sometimes falls unwitnessed but when they were witnessed patient apparently was ambulating. Patient is unable to provide his history. He was seen in the emergency department 1 day prior to admission, for the fall and had a laceration to the head which was repaired. Patient endorsed pain all over but otherwise unable to provide more detailed information. In emergency department he was noted to have hemoglobin of 7.9% unclear what his recent baseline was. Last hemoglobin from 2015 was 9.5 he was Hemoccult negative from below. Patient was noted to have elevated ammonia level up to 71. CT scan of the head showed right frontal scalp hematoma and chronic right sphenoid  sinusitis was a question whether this possibility infection entering the right middle cranial. Recommendation was to obtain MRI Per nursing home staff patient had been more agitated. Unable to obtain history from the patient Hospitalist was called for admission for recurrent falls and confusion   Hospital Course:  #1 hepatic encephalopathy Patient with history of cirrhosis presented worsening confusion, recurrent falls initial lab work showed an elevated ammonia level of 71. Patient was given increased dose of lactulose. Ammonia levels trended down. Patient with no source of infection. Chest x-ray negative, urinalysis negative, xray of the foot negative for osteomyelitis. Patient currently afebrile. patient was continued on lactulose. Patient was seen in consultation by palliative care regarding goals of care meeting with patient and family.   it was decided at goals of care meeting that patient was a DO NOT RESUSCITATE, full comfort approach with dignity. Patient was noted to have an acute decline in his Alzheimer's dementia. Patient was noted to be agitated with pain. Main goals were comfort and dignity. Patient did not have any further transfusions, no rehospitalization, no lab draws, avoid restraints, diet was advanced to a regular diet for comfort feeds. Telemetry was discontinued. Patient's medications were adjusted for symptom management including increasing his Ativan to 3 times daily and starting patient on risperidone. A Duragesic patch was also placed for pain control. Patient will be discharged to a skilled nursing facility with hospice.   #2 acute on chronic anemia Hemoglobin currently stable. Hemoccult-negative. Patient was transfused 2 units of packed red blood cells. Patient's hemoglobin remained stable for the rest of  the hospitalization.   #3 advanced dementia with behavioral disturbance Patient noted to have acute agitation with occasional outburts during the hospitalization. Haldol  was held secondary to prolonged QTC. Repeat EKG with some improvement in QTC however showing new left bundle branch block. Ativan dose has been increased to 3 times daily and patient has been started on twice daily Risperdal per palliative care. Patient has also been placed on a Duragesic patch for pain. Patient has been seen by palliative care and full comfort approach was desired by family during goals of care meeting..   #4 new left bundle branch block Patient with no chest pain. Patient with some agitation. Repeat EKG showed a new left bundle branch block. Patient denieD any chest pain. Cardiac enzymes minimally elevated. 2 d echo with EF of 50-55%, mild LVH, moderate aortic valvular stenosis. Patient now full comfort. No further workup needed at this time.  #5 prolonged QTC Repeat EKG with some improvement in QTC. Patient's Haldol was held on admission secondary to prolonged QTC. Haldol was discontinued. Patient was made full comfort care and was placed on Ativan 3 times a day as well as Risperdal.   #6 falls PT/OT.   #7 hypernatremia Improved.   #8 prognosis patient with a history of advanced dementia with some agitation. Patient also noted to have a history of cirrhosis and some encephalopathy. Patient alert to self only. Patient with occasional outbursts. Patient Was been seen by palliative care and goal of care is comfort approach. Patient's medications were adjusted and patient will be discharged to a skilled nursing facility with hospice. Please make referral to hospice once patient arrives at the facility.     Procedures:  X-ray of the hips/pelvis 12/03/2014  X-ray of the foot 12/03/2014  X-ray of the L-spine 12/03/2014  Chest x-ray 12/03/2014  2 units packed red blood cells 12/04/2014  2-D echo 12/06/2014  Consultations:  Palliative care: Dr. Hilma Favors 12/08/2014    Discharge Exam: Filed Vitals:   12/11/14 1347  BP: 178/52  Pulse: 84  Temp: 98 F (36.7 C)   Resp: 16    General: Sleeping Cardiovascular: RRR with 3/6 SEM Respiratory: CTAB anterior lung fields  Discharge Instructions   Discharge Instructions    Diet general    Complete by:  As directed      Increase activity slowly    Complete by:  As directed           Current Discharge Medication List    START taking these medications   Details  feeding supplement, ENSURE COMPLETE, (ENSURE COMPLETE) LIQD Take 237 mLs by mouth 2 (two) times daily between meals.    fentaNYL (DURAGESIC - DOSED MCG/HR) 12 MCG/HR Place 1 patch (12.5 mcg total) onto the skin every 3 (three) days. Qty: 5 patch, Refills: 0    LORazepam (ATIVAN) 1 MG tablet Take 1 tablet (1 mg total) by mouth 3 (three) times daily. Qty: 30 tablet, Refills: 0    risperiDONE (RISPERDAL M-TABS) 1 MG disintegrating tablet Take 1 tablet (1 mg total) by mouth 2 (two) times daily. Qty: 60 tablet, Refills: 0      CONTINUE these medications which have CHANGED   Details  lactulose (CHRONULAC) 10 GM/15ML solution Take 45 mLs (30 g total) by mouth 3 (three) times daily. Qty: 240 mL, Refills: 0      CONTINUE these medications which have NOT CHANGED   Details  aspirin 81 MG tablet Take 81 mg by mouth daily.    omeprazole (PRILOSEC)  20 MG capsule Take 20 mg by mouth daily.    rivastigmine (EXELON) 9.5 mg/24hr Place 9.5 mg onto the skin daily.    sucralfate (CARAFATE) 1 G tablet Take 1 g by mouth 2 (two) times daily.    acetaminophen (TYLENOL) 500 MG tablet Take 500 mg by mouth every 4 (four) hours as needed for moderate pain or fever.    alum & mag hydroxide-simeth (MAALOX/MYLANTA) 200-200-20 MG/5ML suspension Take 30 mLs by mouth every 6 (six) hours as needed for indigestion or heartburn.    latanoprost (XALATAN) 0.005 % ophthalmic solution Place 1 drop into both eyes at bedtime.    loperamide (IMODIUM) 2 MG capsule Take 2 mg by mouth daily as needed for diarrhea or loose stools.    magnesium hydroxide (MILK OF  MAGNESIA) 400 MG/5ML suspension Take 30 mLs by mouth daily as needed for mild constipation.    neomycin-bacitracin-polymyxin (NEOSPORIN) [phone] ointment Apply 1 application topically daily as needed (for skin tears or abrasions).      STOP taking these medications     bicalutamide (CASODEX) 50 MG tablet      Cholecalciferol (VITAMIN D-3) 5000 UNITS TABS      furosemide (LASIX) 20 MG tablet      haloperidol (HALDOL) 1 MG tablet      Multiple Vitamin (THERA/BETA-CAROTENE) TABS      traMADol (ULTRAM) 50 MG tablet      traMADol (ULTRAM) 50 MG tablet      guaiFENesin (ROBITUSSIN) 100 MG/5ML liquid      Melatonin 1 MG TABS        No Known Allergies Follow-up Information    Please follow up.   Why:  F/U WITH MD AT SNF       The results of significant diagnostics from this hospitalization (including imaging, microbiology, ancillary and laboratory) are listed below for reference.    Significant Diagnostic Studies: Dg Chest 1 View  12/03/2014   CLINICAL DATA:  Unwitnessed fall.  EXAM: CHEST  1 VIEW  COMPARISON:  None.  FINDINGS: The aorta is tortuous. The heart size is normal. The mediastinal contour is normal. There is no focal infiltrate, pulmonary edema, or pleural effusion. Degenerative joint changes of both shoulders are identified. The bones are otherwise unremarkable. The visualized skeletal structures are unremarkable.  IMPRESSION: No active cardiopulmonary disease.   Electronically Signed   By: Abelardo Diesel M.D.   On: 12/03/2014 18:22   Dg Lumbar Spine Complete  12/03/2014   CLINICAL DATA:  Status post fall today. Low back pain. Initial encounter.  EXAM: LUMBAR SPINE - COMPLETE 4+ VIEW  COMPARISON:  Plain films lumbar spine 11/07/2014.  FINDINGS: No fracture or malalignment is identified. Multilevel spondylosis is seen, unchanged. Extensive atherosclerosis is noted.  IMPRESSION: No acute abnormality.   Electronically Signed   By: Inge Rise M.D.   On: 12/03/2014  18:24   Ct Head Wo Contrast  12/03/2014   CLINICAL DATA:  81 year old male with history of trauma from a fall. Dementia. Combative patient.  EXAM: CT HEAD WITHOUT CONTRAST  CT CERVICAL SPINE WITHOUT CONTRAST  TECHNIQUE: Multidetector CT imaging of the head and cervical spine was performed following the standard protocol without intravenous contrast. Multiplanar CT image reconstructions of the cervical spine were also generated.  COMPARISON:  Head and cervical spine CT 12/02/2014.  FINDINGS: CT HEAD FINDINGS  Large right frontal scalp hematoma is slightly smaller than yesterday's examination no underlying displaced skull fracture. Moderate cerebral and mild cerebellar atrophy. Patchy and confluent  areas of decreased attenuation are noted throughout the deep and periventricular white matter of the cerebral hemispheres bilaterally, compatible with chronic microvascular ischemic disease. No acute intracranial abnormalities. Specifically, no signs of acute posttraumatic intracranial hemorrhage, no mass, mass effect, hydrocephalus or abnormal intra or extra-axial fluid collections. Mastoids are well pneumatized. Paranasal sinuses are generally well pneumatized, with exception of extensive mucoperiosteal thickening in the right sphenoid sinus, with extensive cortical thinning in the lateral wall of the right sphenoid sinus, similar to the prior examination.  CT CERVICAL SPINE FINDINGS  No acute displaced fracture of the cervical spine. 3 mm of anterolisthesis of C4 on C5 appears to be chronic and is unchanged. Reversal of normal cervical lordosis centered at the level of C5 also unchanged and presumably chronic. Alignment is otherwise anatomic. Prevertebral soft tissues are normal. Severe multilevel degenerative disc disease, most pronounced at C5-C6, C6-C7 and C7-T1. Multilevel facet arthropathy. Mild paraseptal emphysema in the lung apices.  IMPRESSION: 1. Slight decreased size of large right frontal scalp hematoma. No  signs of new trauma to the skull, brain or cervical spine. 2. Moderate cerebral and mild cerebellar atrophy with extensive chronic microvascular ischemic changes in the cerebral white matter. 3. Severe multilevel degenerative disc disease and cervical spondylosis redemonstrated, as above. 4. Chronic right sphenoid sinusitis redemonstrated. As noted on the prior examination, there is very poor delineation of the cortex of the lateral wall of the right sphenoid sinus, without definite fluid collection in the right middle cranial fossa to suggest frank intracranial extension of sinus disease.   Electronically Signed   By: Vinnie Langton M.D.   On: 12/03/2014 18:38   Ct Head Wo Contrast  12/02/2014   CLINICAL DATA:  Unwitnessed fall. Contusion along the right forehead. Alzheimer dementia. Head injury.  EXAM: CT HEAD WITHOUT CONTRAST  CT CERVICAL SPINE WITHOUT CONTRAST  TECHNIQUE: Multidetector CT imaging of the head and cervical spine was performed following the standard protocol without intravenous contrast. Multiplanar CT image reconstructions of the cervical spine were also generated.  COMPARISON:  11/07/2014  FINDINGS: CT HEAD FINDINGS  Chronic lacunar infarcts an both basal ganglia. Periventricular white matter and corona radiata hypodensities favor chronic ischemic microvascular white matter disease.  Otherwise, The brainstem, cerebellum, cerebral peduncles, thalamus, basal ganglia, basilar cisterns, and ventricular system appear within normal limits. No intracranial hemorrhage, mass lesion, or acute CVA. Right forehead scalp hematoma noted.  Poor cortical definition of the right lateral wall of the right sphenoid sinus from the middle cranial fossa. Chronic right sphenoid sinusitis. Appearance not significantly changed from 11/07/2014.  Remote left medial orbital wall fracture. There is atherosclerotic calcification of the cavernous carotid arteries bilaterally.  CT CERVICAL SPINE FINDINGS  Multilevel  cervical spondylosis and degenerative disc disease with loss of disc height most notable at C5-6 and C6-7, a superior endplate Schmorl's node at T1, and spurring causing osseous foraminal stenosis on the left at C2-3 and C3-4. Straightening of the cervical lordosis. Aortic and branch vessel atherosclerotic calcification.  No acute cervical spine findings observed.  IMPRESSION: 1. Right forehead scalp hematoma.  No acute intracranial findings. 2. Chronic small lacunar infarcts in both basal ganglia. Periventricular white matter and corona radiata hypodensities favor chronic ischemic microvascular white matter disease. 3. Cervical spondylosis and degenerative disc disease. 4. Atherosclerosis. 5. Remote left medial orbital wall fracture. 6. Chronic right sphenoid sinusitis, with poor cortical definition of the right lateral wall of the sphenoid sinus presumably related to the chronic sinusitis. Given that the sphenoid sinus  is not completely opacified, I am doubtful that this represents an acute or urgent situation with the possibility of infection entering the right middle cranial fossa, but if the patient were to exhibit unusual neurologic signs than MRI brain with without contrast might be warranted.   Electronically Signed   By: Van Clines M.D.   On: 12/02/2014 20:27   Ct Cervical Spine Wo Contrast  12/03/2014   CLINICAL DATA:  81 year old male with history of trauma from a fall. Dementia. Combative patient.  EXAM: CT HEAD WITHOUT CONTRAST  CT CERVICAL SPINE WITHOUT CONTRAST  TECHNIQUE: Multidetector CT imaging of the head and cervical spine was performed following the standard protocol without intravenous contrast. Multiplanar CT image reconstructions of the cervical spine were also generated.  COMPARISON:  Head and cervical spine CT 12/02/2014.  FINDINGS: CT HEAD FINDINGS  Large right frontal scalp hematoma is slightly smaller than yesterday's examination no underlying displaced skull fracture. Moderate  cerebral and mild cerebellar atrophy. Patchy and confluent areas of decreased attenuation are noted throughout the deep and periventricular white matter of the cerebral hemispheres bilaterally, compatible with chronic microvascular ischemic disease. No acute intracranial abnormalities. Specifically, no signs of acute posttraumatic intracranial hemorrhage, no mass, mass effect, hydrocephalus or abnormal intra or extra-axial fluid collections. Mastoids are well pneumatized. Paranasal sinuses are generally well pneumatized, with exception of extensive mucoperiosteal thickening in the right sphenoid sinus, with extensive cortical thinning in the lateral wall of the right sphenoid sinus, similar to the prior examination.  CT CERVICAL SPINE FINDINGS  No acute displaced fracture of the cervical spine. 3 mm of anterolisthesis of C4 on C5 appears to be chronic and is unchanged. Reversal of normal cervical lordosis centered at the level of C5 also unchanged and presumably chronic. Alignment is otherwise anatomic. Prevertebral soft tissues are normal. Severe multilevel degenerative disc disease, most pronounced at C5-C6, C6-C7 and C7-T1. Multilevel facet arthropathy. Mild paraseptal emphysema in the lung apices.  IMPRESSION: 1. Slight decreased size of large right frontal scalp hematoma. No signs of new trauma to the skull, brain or cervical spine. 2. Moderate cerebral and mild cerebellar atrophy with extensive chronic microvascular ischemic changes in the cerebral white matter. 3. Severe multilevel degenerative disc disease and cervical spondylosis redemonstrated, as above. 4. Chronic right sphenoid sinusitis redemonstrated. As noted on the prior examination, there is very poor delineation of the cortex of the lateral wall of the right sphenoid sinus, without definite fluid collection in the right middle cranial fossa to suggest frank intracranial extension of sinus disease.   Electronically Signed   By: Vinnie Langton M.D.    On: 12/03/2014 18:38   Ct Cervical Spine Wo Contrast  12/02/2014   CLINICAL DATA:  Unwitnessed fall. Contusion along the right forehead. Alzheimer dementia. Head injury.  EXAM: CT HEAD WITHOUT CONTRAST  CT CERVICAL SPINE WITHOUT CONTRAST  TECHNIQUE: Multidetector CT imaging of the head and cervical spine was performed following the standard protocol without intravenous contrast. Multiplanar CT image reconstructions of the cervical spine were also generated.  COMPARISON:  11/07/2014  FINDINGS: CT HEAD FINDINGS  Chronic lacunar infarcts an both basal ganglia. Periventricular white matter and corona radiata hypodensities favor chronic ischemic microvascular white matter disease.  Otherwise, The brainstem, cerebellum, cerebral peduncles, thalamus, basal ganglia, basilar cisterns, and ventricular system appear within normal limits. No intracranial hemorrhage, mass lesion, or acute CVA. Right forehead scalp hematoma noted.  Poor cortical definition of the right lateral wall of the right sphenoid sinus from the middle  cranial fossa. Chronic right sphenoid sinusitis. Appearance not significantly changed from 11/07/2014.  Remote left medial orbital wall fracture. There is atherosclerotic calcification of the cavernous carotid arteries bilaterally.  CT CERVICAL SPINE FINDINGS  Multilevel cervical spondylosis and degenerative disc disease with loss of disc height most notable at C5-6 and C6-7, a superior endplate Schmorl's node at T1, and spurring causing osseous foraminal stenosis on the left at C2-3 and C3-4. Straightening of the cervical lordosis. Aortic and branch vessel atherosclerotic calcification.  No acute cervical spine findings observed.  IMPRESSION: 1. Right forehead scalp hematoma.  No acute intracranial findings. 2. Chronic small lacunar infarcts in both basal ganglia. Periventricular white matter and corona radiata hypodensities favor chronic ischemic microvascular white matter disease. 3. Cervical  spondylosis and degenerative disc disease. 4. Atherosclerosis. 5. Remote left medial orbital wall fracture. 6. Chronic right sphenoid sinusitis, with poor cortical definition of the right lateral wall of the sphenoid sinus presumably related to the chronic sinusitis. Given that the sphenoid sinus is not completely opacified, I am doubtful that this represents an acute or urgent situation with the possibility of infection entering the right middle cranial fossa, but if the patient were to exhibit unusual neurologic signs than MRI brain with without contrast might be warranted.   Electronically Signed   By: Van Clines M.D.   On: 12/02/2014 20:27   Dg Foot Complete Right  12/03/2014   CLINICAL DATA:  Ulcerative the anterior surface of the second toe.  EXAM: RIGHT FOOT COMPLETE - 3+ VIEW  COMPARISON:  None.  FINDINGS: Diffuse bone demineralization. Vascular calcifications. Degenerative changes at the first metatarsal-phalangeal joint and multiple interphalangeal joints. Degenerative changes in the intertarsal joints. Deformity of the calcaneus may be due to degenerative change or old fracture deformity. Degenerative changes in the tibiotalar joint. No focal bone erosion or cortical disruption that would suggest evidence of osteomyelitis.  IMPRESSION: Diffuse bony demineralization and degenerative changes. No acute bony abnormalities. No findings to suggest osteomyelitis.   Electronically Signed   By: Lucienne Capers M.D.   On: 12/03/2014 23:17   Dg Hips Bilat With Pelvis 3-4 Views  12/03/2014   CLINICAL DATA:  Status post fall. Bilateral hip pain. Initial encounter.  EXAM: BILATERAL HIP (WITH PELVIS) 3-4 VIEWS  COMPARISON:  None.  FINDINGS: No acute bony or joint abnormality is identified. No notable degenerative change is seen about the hips. Extensive atherosclerosis is noted.  IMPRESSION: No acute abnormality.   Electronically Signed   By: Inge Rise M.D.   On: 12/03/2014 18:23     Microbiology: Recent Results (from the past 240 hour(s))  MRSA PCR Screening     Status: Abnormal   Collection Time: 12/04/14 12:32 AM  Result Value Ref Range Status   MRSA by PCR POSITIVE (A) NEGATIVE Final    Comment:        The GeneXpert MRSA Assay (FDA approved for NASAL specimens only), is one component of a comprehensive MRSA colonization surveillance program. It is not intended to diagnose MRSA infection nor to guide or monitor treatment for MRSA infections. RESULT CALLED TO, READ BACK BY AND VERIFIED WITH: D. BROWN RN AT 0430 ON 03.08.16 BY SHUEA      Labs: Basic Metabolic Panel:  Recent Labs Lab 12/05/14 1100 12/06/14 0320 12/07/14 0923 12/08/14 0656  NA 146* 151* 147* 145  K 3.3* 3.7 3.9 4.5  CL 118* 119* 117* 112  CO2 24 27 26 25   GLUCOSE 82 137* 113* 119*  BUN 21  24* 16 12  CREATININE 1.10 1.02 0.93 0.84  CALCIUM 8.2* 8.4 8.4 8.6  MG  --  2.0 1.8  --    Liver Function Tests:  Recent Labs Lab 12/05/14 1100 12/06/14 0320  AST 31 42*  ALT 20 24  ALKPHOS 127* 138*  BILITOT 1.1 0.7  PROT 5.9* 6.0  ALBUMIN 2.3* 2.2*   No results for input(s): LIPASE, AMYLASE in the last 168 hours.  Recent Labs Lab 12/05/14 1100 12/06/14 0320  AMMONIA 15 46*   CBC:  Recent Labs Lab 12/05/14 0015 12/05/14 1100 12/06/14 0320 12/07/14 0923 12/08/14 0656  WBC  --  5.0 5.4 5.3 5.4  HGB 9.8* 9.1* 9.4* 9.1* 9.9*  HCT 30.6* 28.8* 29.1* 29.3* 31.4*  MCV  --  94.4 94.8 95.4 94.9  PLT  --  121* 111* 112* 121*   Cardiac Enzymes:  Recent Labs Lab 12/05/14 1610 12/05/14 2233  TROPONINI 0.04* 0.04*   BNP: BNP (last 3 results) No results for input(s): BNP in the last 8760 hours.  ProBNP (last 3 results) No results for input(s): PROBNP in the last 8760 hours.  CBG: No results for input(s): GLUCAP in the last 168 hours.     SignedIrine Seal MD [facility] 12/11/2014, 4:27 PM

## 2014-12-11 NOTE — Progress Notes (Signed)
CSW received consult from Dr. Grandville Silos that Dr. Hilma Favors recommended residential hospice placement. CSW confirmed with patient's daughter, Edwena Felty (cell#: [phone]) that she would prefer [facility] as she lives a few minutes from there.   CSW made referral to Erling Conte, [place] Liaison - awaiting response re: bed availability/eligibility.      Raynaldo Opitz, [facility] Clinical Social Worker cell #: [phone]

## 2014-12-11 NOTE — Progress Notes (Signed)
Patient discharged to SNF via ambulance, discharge packet prepared by CSW for facility and given to EMS transporter. Patient alert and in no distress. See wound on wound flowsheet. On right 2nd toe and  Head, patient had it prior to admission. No pressure ulcer noted.

## 2014-12-13 ENCOUNTER — Non-Acute Institutional Stay (SKILLED_NURSING_FACILITY): Payer: Medicare Other | Admitting: Internal Medicine

## 2014-12-13 DIAGNOSIS — E870 Hyperosmolality and hypernatremia: Secondary | ICD-10-CM | POA: Diagnosis not present

## 2014-12-13 DIAGNOSIS — F03918 Unspecified dementia, unspecified severity, with other behavioral disturbance: Secondary | ICD-10-CM

## 2014-12-13 DIAGNOSIS — I4581 Long QT syndrome: Secondary | ICD-10-CM | POA: Diagnosis not present

## 2014-12-13 DIAGNOSIS — I447 Left bundle-branch block, unspecified: Secondary | ICD-10-CM

## 2014-12-13 DIAGNOSIS — F0391 Unspecified dementia with behavioral disturbance: Secondary | ICD-10-CM | POA: Diagnosis not present

## 2014-12-13 DIAGNOSIS — Z515 Encounter for palliative care: Secondary | ICD-10-CM | POA: Diagnosis not present

## 2014-12-13 DIAGNOSIS — R296 Repeated falls: Secondary | ICD-10-CM

## 2014-12-13 DIAGNOSIS — R9431 Abnormal electrocardiogram [ECG] [EKG]: Secondary | ICD-10-CM

## 2014-12-13 DIAGNOSIS — K7290 Hepatic failure, unspecified without coma: Secondary | ICD-10-CM

## 2014-12-13 DIAGNOSIS — D508 Other iron deficiency anemias: Secondary | ICD-10-CM | POA: Diagnosis not present

## 2014-12-13 DIAGNOSIS — K7682 Hepatic encephalopathy: Secondary | ICD-10-CM

## 2014-12-13 NOTE — Progress Notes (Signed)
MRN: [id number] Name: Patrick Jefferson  Sex: male Age: 81 y.o. DOB: 11-26-1929  [place] #: Helene Kelp Facility/Room:123 Level Of Care: SNF Provider: Inocencio Homes D Emergency Contacts: Extended Emergency Contact Information Primary Emergency Contact: Ferrebee,Lorraine Address: [address] [place], [place] [address] Johnnette Litter of [place] Phone: [phone] Mobile Phone: [phone] Relation: Other  Code Status: DNR  Allergies: Review of patient's allergies indicates no known allergies.  Chief Complaint  Patient presents with  . New Admit To SNF    HPI: Patient is 81 y.o. male who has endstage dementia with hepatic encephalopathy, frequent falls, admitted to SNF for generalized weakness and comfort care..  Past Medical History  Diagnosis Date  . Alzheimer's dementia   . Liver cirrhosis   . Alzheimer's dementia     History reviewed. No pertinent past surgical history.    Medication List       This list is accurate as of: 12/13/14 11:59 PM.  Always use your most recent med list.               acetaminophen 500 MG tablet  Commonly known as:  TYLENOL  Take 500 mg by mouth every 4 (four) hours as needed for moderate pain or fever.     alum & mag hydroxide-simeth 200-200-20 MG/5ML suspension  Commonly known as:  MAALOX/MYLANTA  Take 30 mLs by mouth every 6 (six) hours as needed for indigestion or heartburn.     aspirin 81 MG tablet  Take 81 mg by mouth daily.     feeding supplement (ENSURE COMPLETE) Liqd  Take 237 mLs by mouth 2 (two) times daily between meals.     fentaNYL 12 MCG/HR  Commonly known as:  DURAGESIC - dosed mcg/hr  Place 1 patch (12.5 mcg total) onto the skin every 3 (three) days.     lactulose 10 GM/15ML solution  Commonly known as:  CHRONULAC  Take 45 mLs (30 g total) by mouth 3 (three) times daily.     latanoprost 0.005 % ophthalmic solution  Commonly known as:  XALATAN  Place 1 drop into both eyes at bedtime.     loperamide 2 MG capsule  Commonly known as:  IMODIUM  Take 2 mg by mouth daily as needed for diarrhea or loose stools.     LORazepam 1 MG tablet  Commonly known as:  ATIVAN  Take 1 tablet (1 mg total) by mouth 3 (three) times daily.     magnesium hydroxide 400 MG/5ML suspension  Commonly known as:  MILK OF MAGNESIA  Take 30 mLs by mouth daily as needed for mild constipation.     neomycin-bacitracin-polymyxin [phone] ointment  Apply 1 application topically daily as needed (for skin tears or abrasions).     omeprazole 20 MG capsule  Commonly known as:  PRILOSEC  Take 20 mg by mouth daily.     risperiDONE 1 MG disintegrating tablet  Commonly known as:  RISPERDAL M-TABS  Take 1 tablet (1 mg total) by mouth 2 (two) times daily.     rivastigmine 9.5 mg/24hr  Commonly known as:  EXELON  Place 9.5 mg onto the skin daily.     sucralfate 1 G tablet  Commonly known as:  CARAFATE  Take 1 g by mouth 2 (two) times daily.        No orders of the defined types were placed in this encounter.     There is no immunization history on file for  this patient.  History  Substance Use Topics  . Smoking status: Former Smoker    Quit date: 12/03/2008  . Smokeless tobacco: Former Systems developer    Quit date: 12/03/2008  . Alcohol Use: No    Family history is noncontributory    Review of Systems  UTO from pt;nursing without concerns .   Filed Vitals:   12/16/14 1822  BP: 159/89  Pulse: 83  Temp: 98 F (36.7 C)  Resp: 18    Physical Exam  GENERAL APPEARANCE: Alert, non conversant,  No acute distress.  SKIN: No diaphoresis rash HEAD: Normocephalic, atraumatic  EYES: Conjunctiva/lids clear. Pupils round, reactive. EOMs intact.  EARS: External exam WNL, canals clear. Hearing grossly normal.  NOSE: No deformity or discharge.  MOUTH/THROAT: Lips w/o lesions  RESPIRATORY: Breathing is even, unlabored. Lung sounds are clear   CARDIOVASCULAR: Heart RRR no murmurs, rubs or gallops. No  peripheral edema.   GASTROINTESTINAL: Abdomen is soft, non-tender, not distended w/ normal bowel sounds. GENITOURINARY: Bladder non tender, not distended  MUSCULOSKELETAL: No abnormal joints or musculature NEUROLOGIC:  Cranial nerves 2-12 grossly intact  PSYCHIATRIC: dementia, no behavioral issues  Patient Active Problem List   Diagnosis Date Noted  . Palliative care patient   . Prostate cancer 12/08/2014  . DNR (do not resuscitate) 12/08/2014  . Hypernatremia 12/07/2014  . Encephalopathy acute   . Malnutrition of moderate degree 12/05/2014  . New left bundle branch block (LBBB)   . Falls frequently 12/03/2014  . Dementia with behavioral disturbance 12/03/2014  . Anemia 12/03/2014  . Hepatic encephalopathy 12/03/2014  . Cirrhosis 12/03/2014  . Prolonged Q-T interval on ECG 12/03/2014  . Dehydration 12/03/2014  . Absolute anemia   . Right second toe ulcer     CBC    Component Value Date/Time   WBC 5.4 12/08/2014 0656   RBC 3.31* 12/08/2014 0656   RBC 2.27* 12/04/2014 0510   HGB 9.9* 12/08/2014 0656   HCT 31.4* 12/08/2014 0656   PLT 121* 12/08/2014 0656   MCV 94.9 12/08/2014 0656   LYMPHSABS 0.6* 12/03/2014 1816   MONOABS 0.5 12/03/2014 1816   EOSABS 0.0 12/03/2014 1816   BASOSABS 0.0 12/03/2014 1816    CMP     Component Value Date/Time   NA 145 12/08/2014 0656   K 4.5 12/08/2014 0656   CL 112 12/08/2014 0656   CO2 25 12/08/2014 0656   GLUCOSE 119* 12/08/2014 0656   BUN 12 12/08/2014 0656   CREATININE 0.84 12/08/2014 0656   CALCIUM 8.6 12/08/2014 0656   PROT 6.0 12/06/2014 0320   ALBUMIN 2.2* 12/06/2014 0320   AST 42* 12/06/2014 0320   ALT 24 12/06/2014 0320   ALKPHOS 138* 12/06/2014 0320   BILITOT 0.7 12/06/2014 0320   GFRNONAA 79* 12/08/2014 0656   GFRAA >90 12/08/2014 0656    Assessment and Plan  Hepatic encephalopathy Patient with history of cirrhosis presented worsening confusion, recurrent falls initial lab work showed an elevated ammonia level  of 71. Patient was given increased dose of lactulose. Ammonia levels trended down. Patient with no source of infection. Chest x-ray negative, urinalysis negative, xray of the foot negative for osteomyelitis. Patient currently afebrile. patient was continued on lactulose. Patient was seen in consultation by palliative care regarding goals of care meeting with patient and family. it was decided at goals of care meeting that patient was a DO NOT RESUSCITATE, full comfort approach with dignity. Patient was noted to have an acute decline in his Alzheimer's dementia. Patient was noted  to be agitated with pain. Main goals were comfort and dignity. Patient did not have any further transfusions, no rehospitalization, no lab draws, avoid restraints, diet was advanced to a regular diet for comfort feeds. Telemetry was discontinued. Patient's medications were adjusted for symptom management including increasing his Ativan to 3 times daily and starting patient on risperidone. A Duragesic patch was also placed for pain control. Patient will be discharged to a skilled nursing facility with hospice.     Anemia Hemoglobin currently stable. Hemoccult-negative.Hb was 7.9; Patient was transfused 2 units of packed red blood cells. Patient's hemoglobin remained stable for the rest of the hospitalization.    Dementia with behavioral disturbance Patient noted to have acute agitation with occasional outburts during the hospitalization. Haldol was held secondary to prolonged QTC. Repeat EKG with some improvement in QTC however showing new left bundle branch block. Ativan dose has been increased to 3 times daily and patient has been started on twice daily Risperdal per palliative care. Patient has also been placed on a Duragesic patch for pain. Patient has been seen by palliative care and full comfort approach was desired by family during goals of care meeting..     New left bundle branch block (LBBB) Patient with no chest  pain. Patient with some agitation. Repeat EKG showed a new left bundle branch block. Patient denieD any chest pain. Cardiac enzymes minimally elevated. 2 d echo with EF of 50-55%, mild LVH, moderate aortic valvular stenosis. Patient now full comfort. No further workup needed at this time.    Prolonged Q-T interval on ECG Repeat EKG with some improvement in QTC. Patient's Haldol was held on admission secondary to prolonged QTC. Haldol was discontinued. Patient was made full comfort care and was placed on Ativan 3 times a day as well as Risperdal   Falls frequently Tx with OT/PT   Hypernatremia Improved   Palliative care patient [place] be consulted on admission to SNF and they have     Hennie Duos, MD

## 2014-12-16 ENCOUNTER — Encounter: Payer: Self-pay | Admitting: Internal Medicine

## 2014-12-16 NOTE — Assessment & Plan Note (Signed)
Patient noted to have acute agitation with occasional outburts during the hospitalization. Haldol was held secondary to prolonged QTC. Repeat EKG with some improvement in QTC however showing new left bundle branch block. Ativan dose has been increased to 3 times daily and patient has been started on twice daily Risperdal per palliative care. Patient has also been placed on a Duragesic patch for pain. Patient has been seen by palliative care and full comfort approach was desired by family during goals of care meeting.Marland Kitchen

## 2014-12-16 NOTE — Assessment & Plan Note (Signed)
Repeat EKG with some improvement in QTC. Patient's Haldol was held on admission secondary to prolonged QTC. Haldol was discontinued. Patient was made full comfort care and was placed on Ativan 3 times a day as well as Risperdal

## 2014-12-16 NOTE — Assessment & Plan Note (Signed)
Hemoglobin currently stable. Hemoccult-negative.Hb was 7.9; Patient was transfused 2 units of packed red blood cells. Patient's hemoglobin remained stable for the rest of the hospitalization.

## 2014-12-16 NOTE — Assessment & Plan Note (Signed)
Patient with history of cirrhosis presented worsening confusion, recurrent falls initial lab work showed an elevated ammonia level of 71. Patient was given increased dose of lactulose. Ammonia levels trended down. Patient with no source of infection. Chest x-ray negative, urinalysis negative, xray of the foot negative for osteomyelitis. Patient currently afebrile. patient was continued on lactulose. Patient was seen in consultation by palliative care regarding goals of care meeting with patient and family. it was decided at goals of care meeting that patient was a DO NOT RESUSCITATE, full comfort approach with dignity. Patient was noted to have an acute decline in his Alzheimer's dementia. Patient was noted to be agitated with pain. Main goals were comfort and dignity. Patient did not have any further transfusions, no rehospitalization, no lab draws, avoid restraints, diet was advanced to a regular diet for comfort feeds. Telemetry was discontinued. Patient's medications were adjusted for symptom management including increasing his Ativan to 3 times daily and starting patient on risperidone. A Duragesic patch was also placed for pain control. Patient will be discharged to a skilled nursing facility with hospice.

## 2014-12-16 NOTE — Assessment & Plan Note (Signed)
Tx with OT/PT

## 2014-12-16 NOTE — Assessment & Plan Note (Signed)
Hosp suggest Hospice be consulted on admission to SNF and they have

## 2014-12-16 NOTE — Assessment & Plan Note (Signed)
Improved

## 2014-12-16 NOTE — Assessment & Plan Note (Signed)
Patient with no chest pain. Patient with some agitation. Repeat EKG showed a new left bundle branch block. Patient denieD any chest pain. Cardiac enzymes minimally elevated. 2 d echo with EF of 50-55%, mild LVH, moderate aortic valvular stenosis. Patient now full comfort. No further workup needed at this time.

## 2015-01-04 ENCOUNTER — Non-Acute Institutional Stay (SKILLED_NURSING_FACILITY): Payer: Medicare Other | Admitting: Nurse Practitioner

## 2015-01-04 DIAGNOSIS — K7290 Hepatic failure, unspecified without coma: Secondary | ICD-10-CM | POA: Diagnosis not present

## 2015-01-04 DIAGNOSIS — F0391 Unspecified dementia with behavioral disturbance: Secondary | ICD-10-CM | POA: Diagnosis not present

## 2015-01-04 DIAGNOSIS — F03918 Unspecified dementia, unspecified severity, with other behavioral disturbance: Secondary | ICD-10-CM

## 2015-01-04 DIAGNOSIS — K7682 Hepatic encephalopathy: Secondary | ICD-10-CM

## 2015-01-04 NOTE — Progress Notes (Signed)
Patient ID: Raymie Giammarco, male   DOB: 1930/09/19, 81 y.o.   MRN: [id number]    Nursing Home Location:  [facility]   Place of Service: SNF (17)  PCP: Sande Brothers, MD  No Known Allergies  Chief Complaint  Patient presents with  . Acute Visit    HPI:  Patient is a 81 y.o. male seen today at [facility] at request of hospice nurse due to decline and lethargy. Pt with a pmh of endstage dementia with hepatic encephalopathy, frequent falls, liver cirhosis who is at [facility] for comfort care at end of life and being followed by hospice who has questions and concerns regarding medication management.  Review of Systems:  Review of Systems  Unable to perform ROS: Mental status change    Past Medical History  Diagnosis Date  . Alzheimer's dementia   . Liver cirrhosis   . Alzheimer's dementia    No past surgical history on file. Social History:   reports that he quit smoking about 6 years ago. He quit smokeless tobacco use about 6 years ago. He reports that he does not drink alcohol or use illicit drugs.  No family history on file.  Medications: Patient's Medications  New Prescriptions   No medications on file  Previous Medications   ACETAMINOPHEN (TYLENOL) 500 MG TABLET    Take 500 mg by mouth every 4 (four) hours as needed for moderate pain or fever.   ALUM & MAG HYDROXIDE-SIMETH (MAALOX/MYLANTA) 200-200-20 MG/5ML SUSPENSION    Take 30 mLs by mouth every 6 (six) hours as needed for indigestion or heartburn.   ASPIRIN 81 MG TABLET    Take 81 mg by mouth daily.   FEEDING SUPPLEMENT, ENSURE COMPLETE, (ENSURE COMPLETE) LIQD    Take 237 mLs by mouth 2 (two) times daily between meals.   LATANOPROST (XALATAN) 0.005 % OPHTHALMIC SOLUTION    Place 1 drop into both eyes at bedtime.   LORAZEPAM (ATIVAN) 1 MG TABLET    Take 1 tablet (1 mg total) by mouth 3 (three) times daily.   MAGNESIUM HYDROXIDE (MILK OF MAGNESIA) 400 MG/5ML SUSPENSION    Take 30 mLs by mouth  daily as needed for mild constipation.   NEOMYCIN-BACITRACIN-POLYMYXIN (NEOSPORIN) [phone] OINTMENT    Apply 1 application topically daily as needed (for skin tears or abrasions).   OMEPRAZOLE (PRILOSEC) 20 MG CAPSULE    Take 20 mg by mouth daily.   RISPERIDONE (RISPERDAL M-TABS) 1 MG DISINTEGRATING TABLET    Take 1 tablet (1 mg total) by mouth 2 (two) times daily.   SUCRALFATE (CARAFATE) 1 G TABLET    Take 1 g by mouth 2 (two) times daily.  Modified Medications   No medications on file  Discontinued Medications   FENTANYL (DURAGESIC - DOSED MCG/HR) 12 MCG/HR    Place 1 patch (12.5 mcg total) onto the skin every 3 (three) days.   LACTULOSE (CHRONULAC) 10 GM/15ML SOLUTION    Take 45 mLs (30 g total) by mouth 3 (three) times daily.   LOPERAMIDE (IMODIUM) 2 MG CAPSULE    Take 2 mg by mouth daily as needed for diarrhea or loose stools.   RIVASTIGMINE (EXELON) 9.5 MG/24HR    Place 9.5 mg onto the skin daily.     Physical Exam: Filed Vitals:   01/04/15 1405  BP: 118/76  Pulse: 83  Temp: 98 F (36.7 C)  Resp: 20    Physical Exam  Neck: Normal range of motion. Neck supple.  Cardiovascular:  Normal rate, regular rhythm and normal heart sounds.   Pulmonary/Chest: Effort normal. He has decreased breath sounds.  Abdominal: Soft. Bowel sounds are normal. He exhibits no distension.  Musculoskeletal: He exhibits no edema.  Neurological: He is unresponsive.  Skin: Skin is warm and dry.    Labs reviewed: Basic Metabolic Panel:  Recent Labs  12/04/14 0510  12/06/14 0320 12/07/14 0923 12/08/14 0656  NA 147*  < > 151* 147* 145  K 3.6  < > 3.7 3.9 4.5  CL 116*  < > 119* 117* 112  CO2 25  < > 27 26 25   GLUCOSE 91  < > 137* 113* 119*  BUN 22  < > 24* 16 12  CREATININE 0.97  < > 1.02 0.93 0.84  CALCIUM 8.3*  < > 8.4 8.4 8.6  MG 1.9  --  2.0 1.8  --   PHOS 3.3  --   --   --   --   < > = values in this interval not displayed. Liver Function Tests:  Recent Labs  12/04/14 0510  12/05/14 1100 12/06/14 0320  AST 35 31 42*  ALT 22 20 24   ALKPHOS 140* 127* 138*  BILITOT 0.7 1.1 0.7  PROT 5.8* 5.9* 6.0  ALBUMIN 2.3* 2.3* 2.2*   No results for input(s): LIPASE, AMYLASE in the last 8760 hours.  Recent Labs  12/04/14 0510 12/05/14 1100 12/06/14 0320  AMMONIA 20 15 46*   CBC:  Recent Labs  12/03/14 1816  12/06/14 0320 12/07/14 0923 12/08/14 0656  WBC 4.7  < > 5.4 5.3 5.4  NEUTROABS 3.5  --   --   --   --   HGB 7.9*  < > 9.4* 9.1* 9.9*  HCT 26.2*  < > 29.1* 29.3* 31.4*  MCV 97.8  < > 94.8 95.4 94.9  PLT 140*  < > 111* 112* 121*  < > = values in this interval not displayed. TSH:  Recent Labs  12/04/14 0510  TSH 1.364   A1C: No results found for: HGBA1C Lipid Panel: No results for input(s): CHOL, HDL, LDLCALC, TRIG, CHOLHDL, LDLDIRECT in the last 8760 hours.   Assessment/Plan 1. Hepatic encephalopathy Patient with history of cirrhosis presented worsening confusion, recently hospitalization  and transferred to [facility] for comfort care. Pt is now obtunded due to over sedation vs hepatic encephalopathy. GOAL is COMFORT. Staff is unable to safely give PO medications, will DC depakote, ASA, carafate and Risperdal at this time (has not been routinely getting due to lethargy. -will cont oxycodone but decrease to q 8 hours with additional dose q 4 hours IF NEEDED for pain -change ativan to PRN only due to sedation   2. Dementia with behavioral disturbance -pt with hx of significant behavioral disturbance per hospice nurse and staff, may need to restart risperdal if pt become agitated due to pain and unable to manage with ativan and pain regimen.    30 min  Time TOTAL:  time greater than 50% of total time spent doing coordination of care regarding end of life care, discussing plan of care with hospice nurse and nursing home staff.

## 2015-01-27 DEATH — deceased

## 2015-01-30 ENCOUNTER — Encounter: Payer: Medicare Other | Admitting: Vascular Surgery

## 2016-08-27 IMAGING — CR DG LUMBAR SPINE COMPLETE 4+V
5 series · 5 of 5 positions shown · non-contrast
Comparison: None.

CLINICAL DATA: Recent fall with low back pain radiating into the
hips bilaterally

EXAM:
LUMBAR SPINE - COMPLETE 4+ VIEW

[t lumbar spine ap]
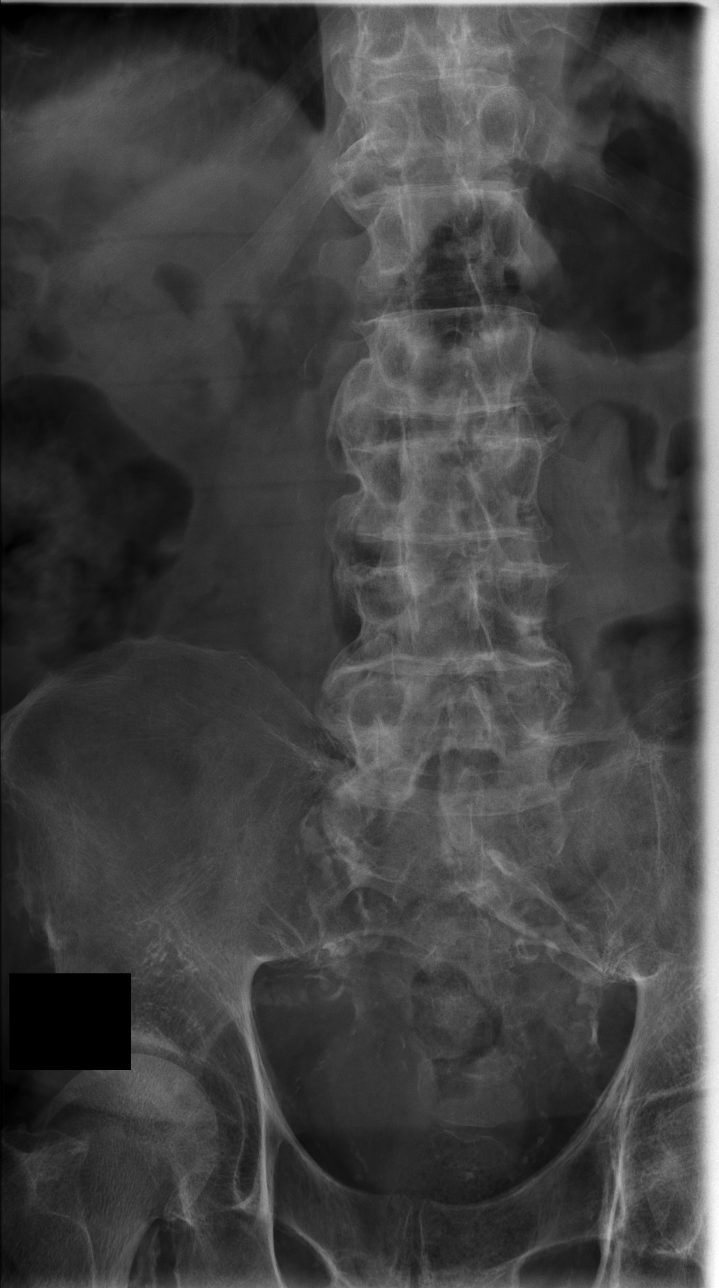

[t lumbar spine obl (1 of 2)]
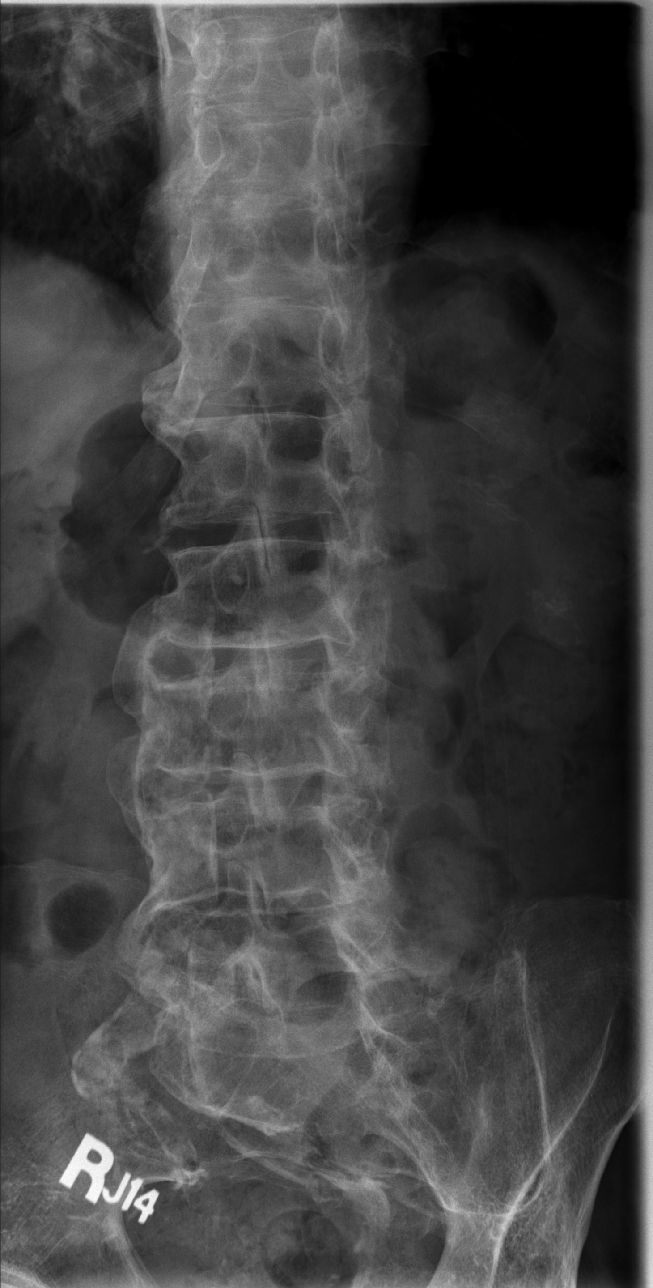

[t lumbar spine obl (2 of 2)]
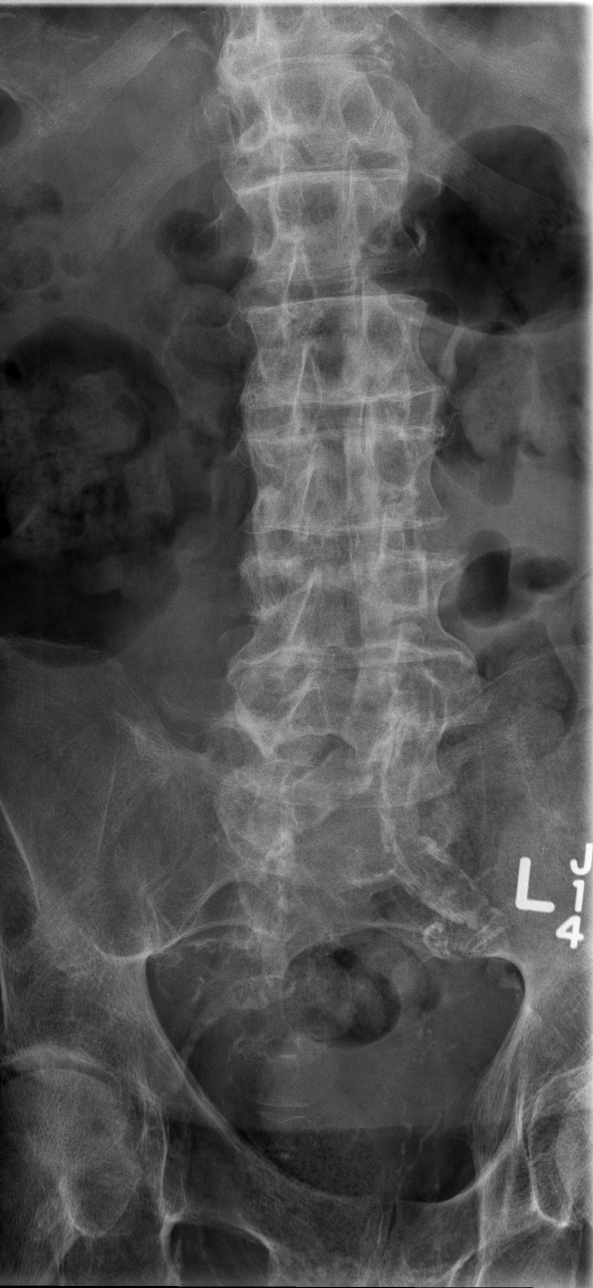

[t lumbar spine lat]
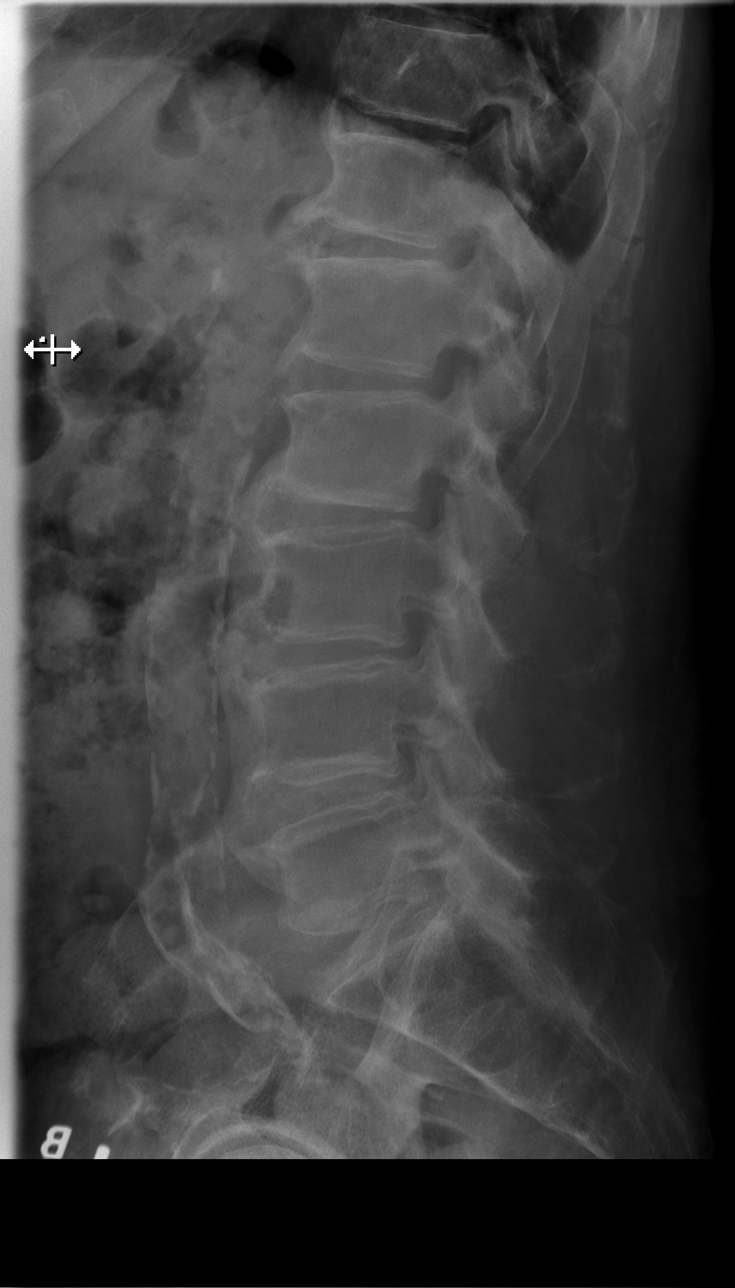

[t lumbar l-5 s-1 spot]
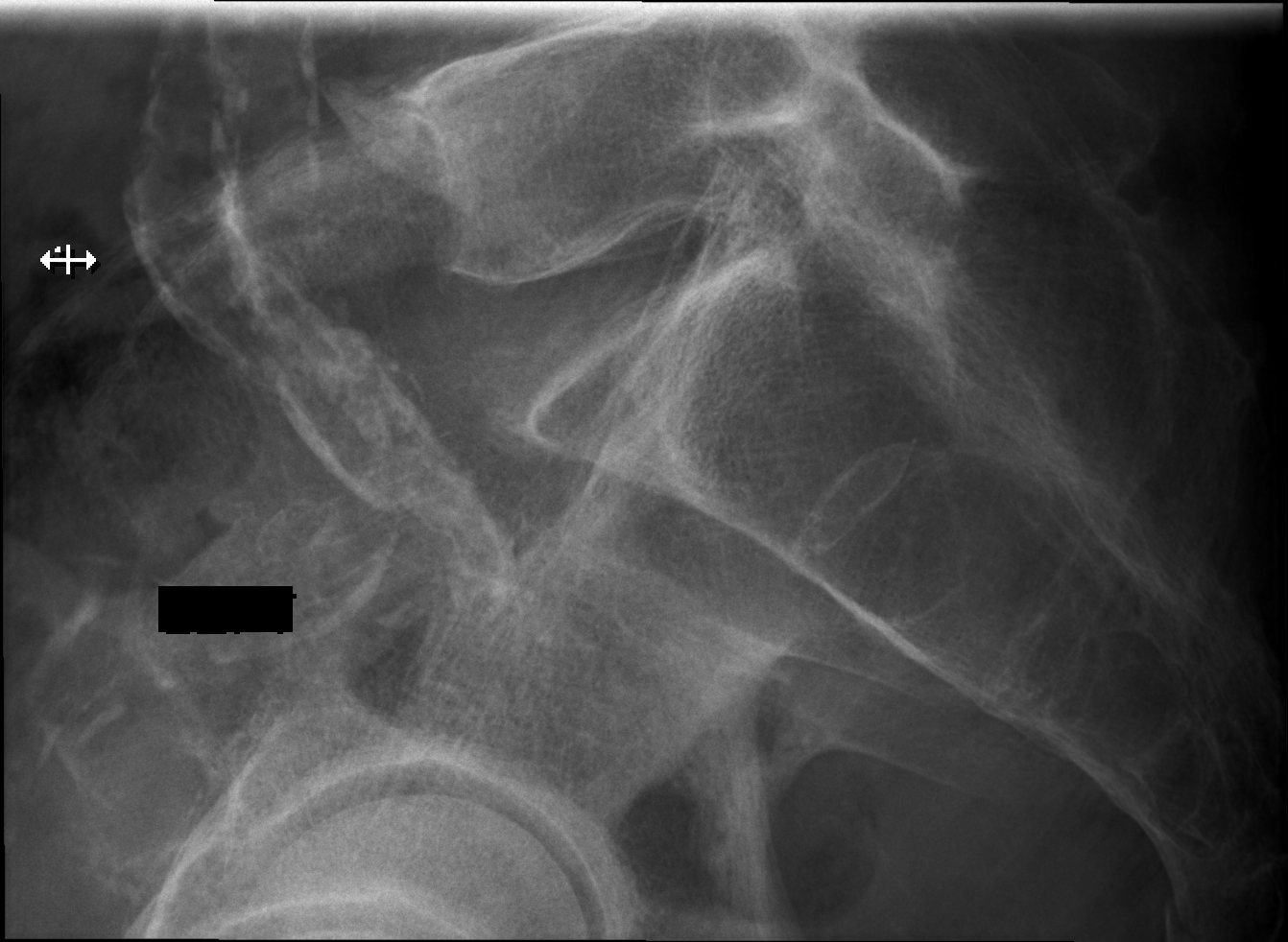

[5 of 5 positions shown; findings below may reference images not displayed]

FINDINGS: Five lumbar type vertebral bodies are well visualized. Vertebral
body height is well maintained. Osteophytic changes are noted at all
levels. No significant disc space narrowing is seen. No pars defects
are noted. Diffuse aortoiliac calcifications are seen.
IMPRESSION: Multilevel degenerative change without acute abnormality.

## 2016-08-27 IMAGING — CT CT HEAD W/O CM
4 series · 18 of 37 positions shown, 19 images · non-contrast
Comparison: None.

CLINICAL DATA: Fall with head injury.  Initial encounter.

EXAM:
CT HEAD WITHOUT CONTRAST
CT CERVICAL SPINE WITHOUT CONTRAST
TECHNIQUE: Multidetector CT imaging of the head and cervical spine was
performed following the standard protocol without intravenous
contrast. Multiplanar CT image reconstructions of the cervical spine
were also generated.

[Series 3: c-spine st · axial · 0.41mm/px · z∈[-240,-166]mm · 5 of 81 slices shown]
[im 6/81  brain]
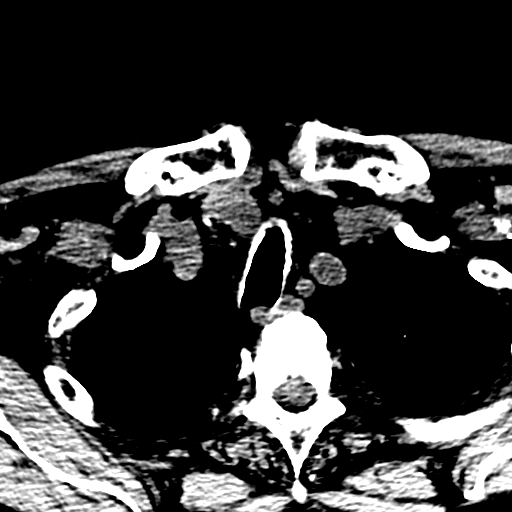
[im 17/81  brain]
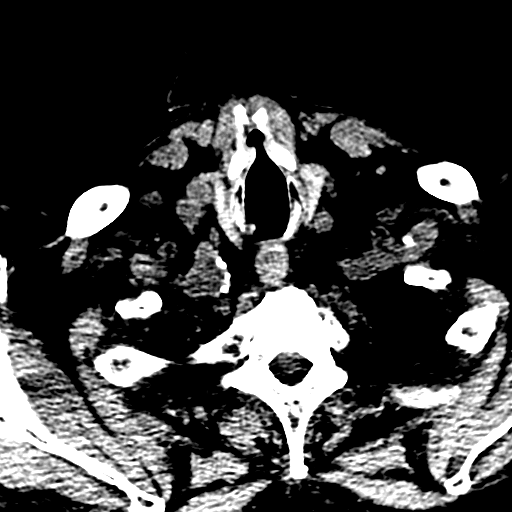
[im 27/81  brain]
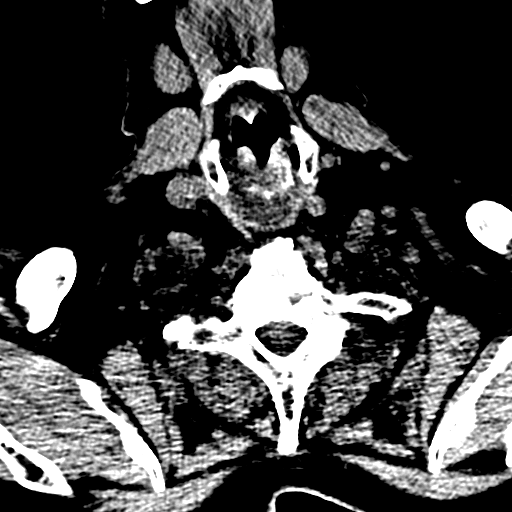
[im 38/81  brain]
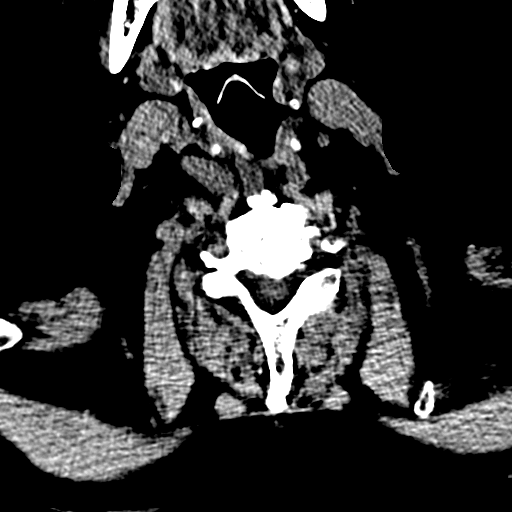
[im 43/81  brain]
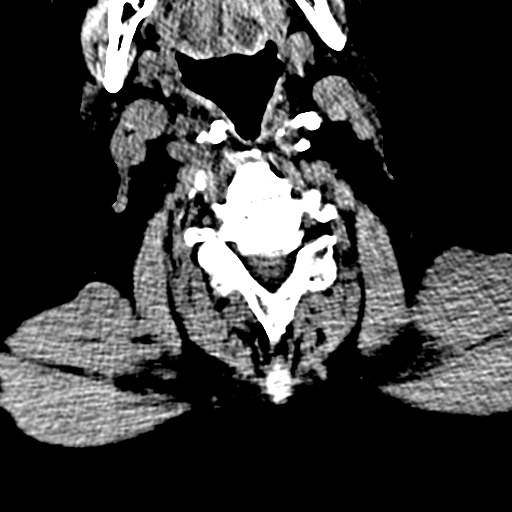

[Series 6: head w/o · axial · non-contrast · 0.43mm/px · z∈[-71,-1]mm · 3 of 29 slices shown, 4 images]
[im 8/29  brain]
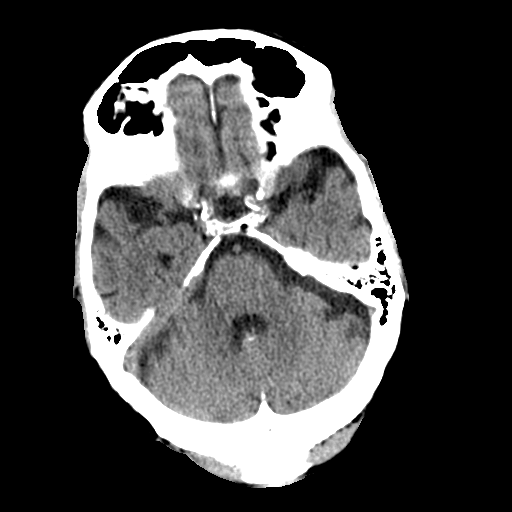
[im 8/29  bone]
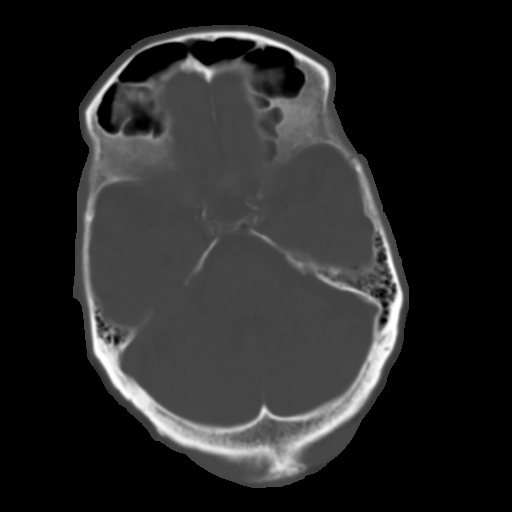
[im 15/29  brain]
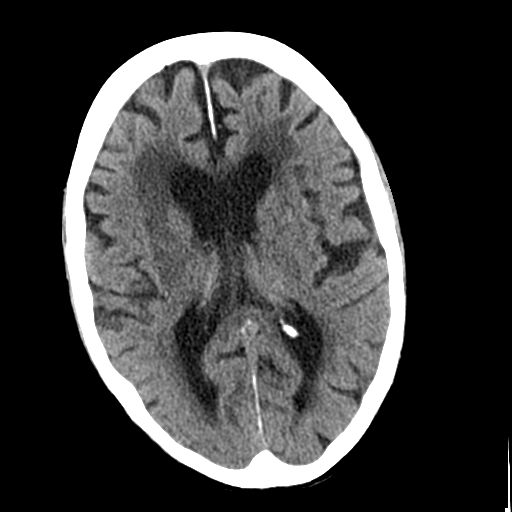
[im 22/29  brain]
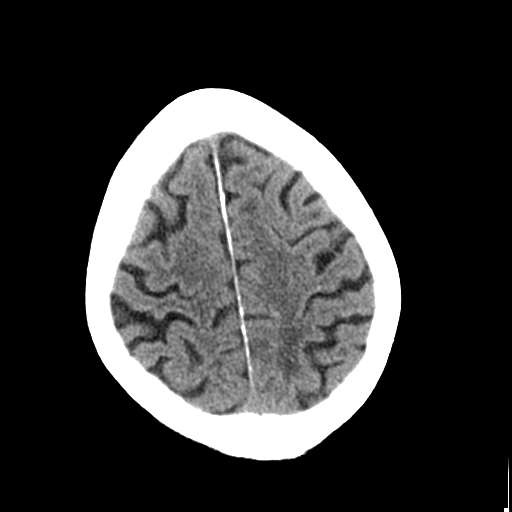

[Series 7: bone windows · axial · 0.43mm/px · z∈[-88,+20]mm · 7 of 49 slices shown]
[im 7/49  bone]
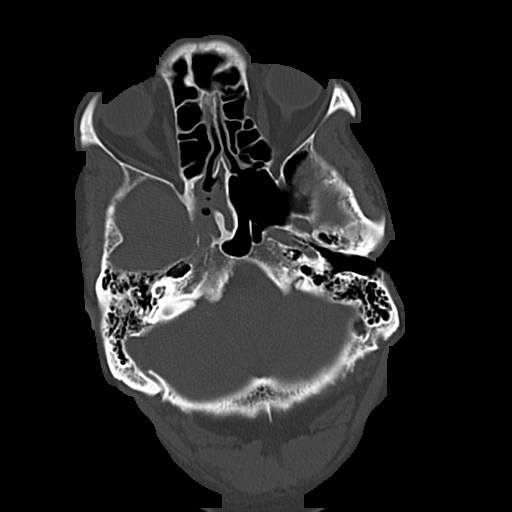
[im 13/49  bone]
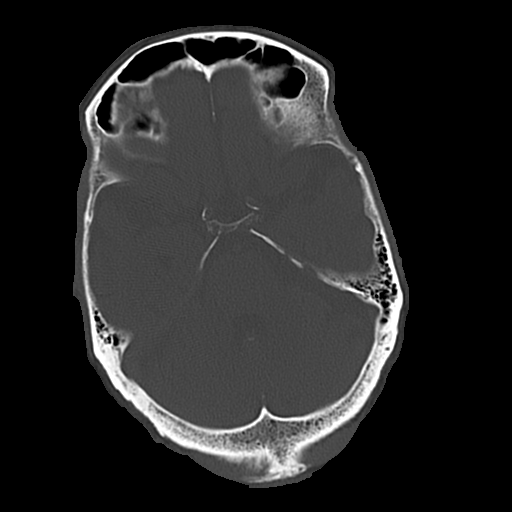
[im 19/49  bone]
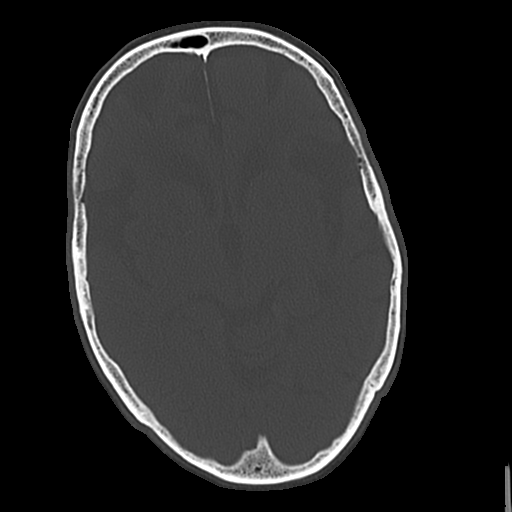
[im 25/49  bone]
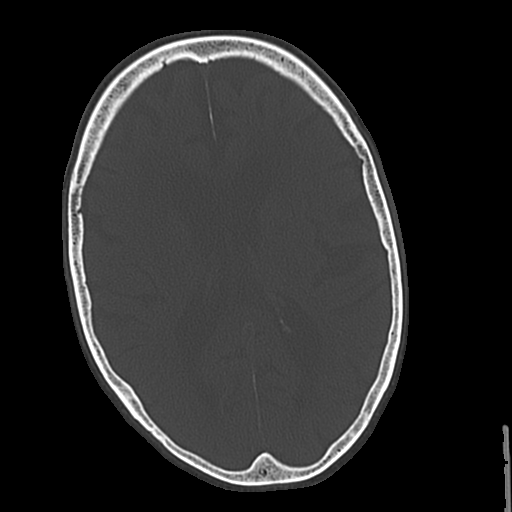
[im 31/49  bone]
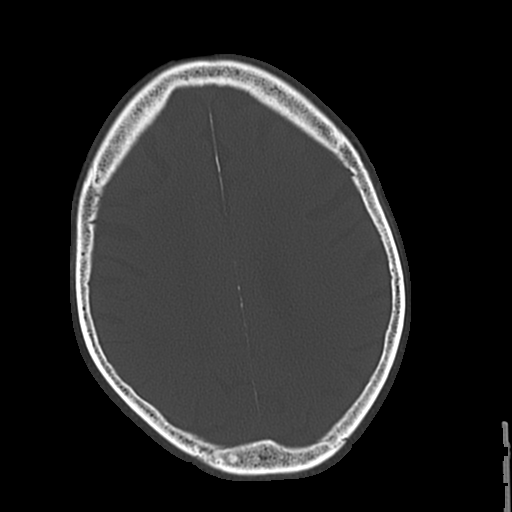
[im 37/49  bone]
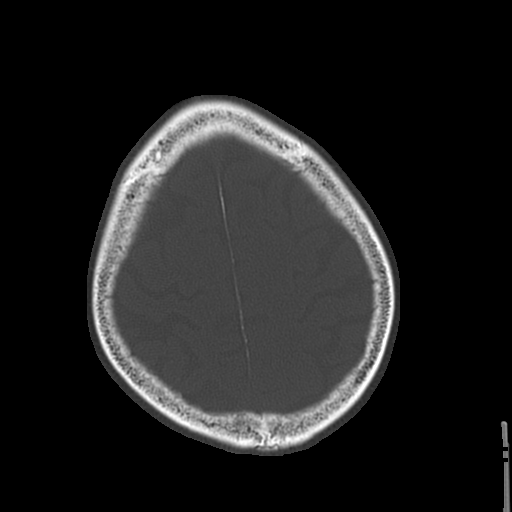
[im 43/49  bone]
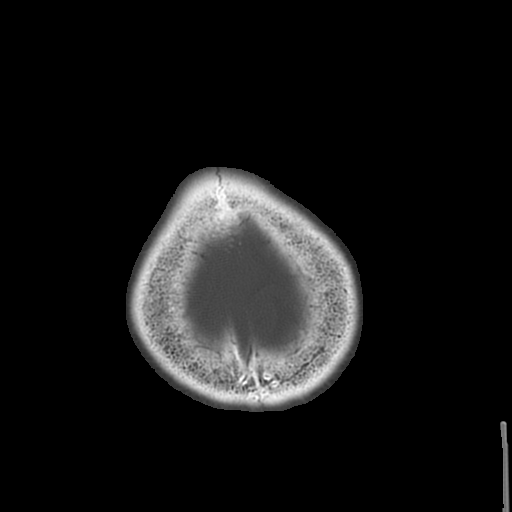

[Series 604: <mpr thick range(2)> · sagittal · 0.41mm/px · 3 of 46 slices shown]
[im 16/46  brain]
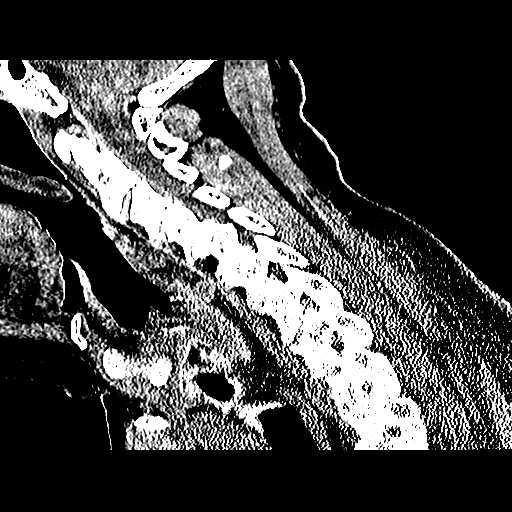
[im 23/46  brain]
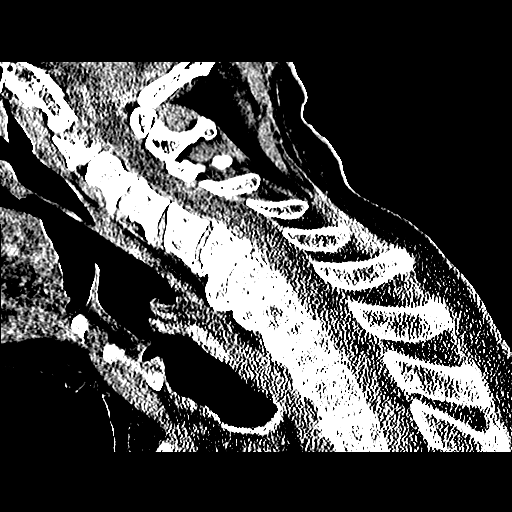
[im 31/46  brain]
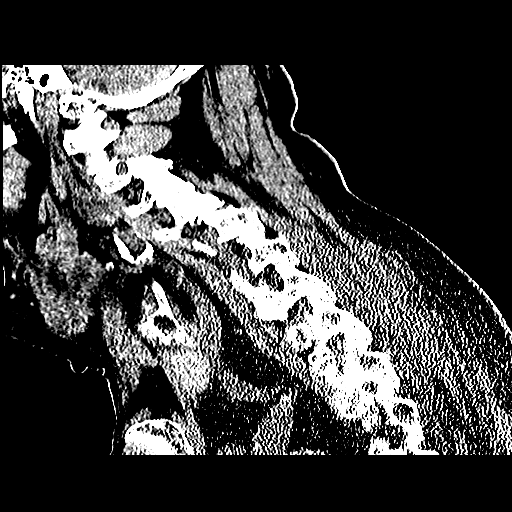

[18 of 37 positions shown; findings below may reference images not displayed]

FINDINGS: CT HEAD FINDINGS

Skull and Sinuses:There is opacification the right sphenoid sinus,
with large lateral recess. No mucocele as the anterior sphenoid
sinus is aerated and the sphenoid sinus ostium is patent. Symmetric
appearance of the carotid canals.

Orbits: Bilateral cataract resection.  No traumatic findings

Brain: No evidence of acute infarction, hemorrhage, hydrocephalus,
or mass lesion/mass effect.

There is advanced chronic small vessel disease with ischemic gliosis
throughout the deep cerebral white matter. Heterogeneous and
indistinct appearance of the bilateral putamen is likely also from
chronic hypertension/small-vessel disease. Generalized brain
atrophy.

CT CERVICAL SPINE FINDINGS

Negative for acute fracture or subluxation. Irregularity of the
superior endplate of T1 is most consistent with a chronic Schmorl's
node. No prevertebral edema. No gross cervical canal hematoma.

Diffuse degenerative disc narrowing and spondylotic spurring, most
progressed in the mid and lower cervical spine. Moderate, diffuse
facet arthropathy with spurring. No evidence for high-grade canal
stenosis.

Heavy deposition of calcified atherosclerotic plaque at the carotid
bifurcations.
IMPRESSION: 1. No evidence of acute intracranial or cervical spine injury.
2. Brain atrophy and advanced small vessel disease.
3. Chronic right sphenoid sinusitis.
